# Patient Record
Sex: Male | Born: 1990 | Race: White | Hispanic: No | Marital: Married | Smoking: Never smoker
Health system: Southern US, Community
[De-identification: ages and names within clinical notes are randomized; demographics above are authoritative.]

## PROBLEM LIST (undated history)

## (undated) DIAGNOSIS — J309 Allergic rhinitis, unspecified: Secondary | ICD-10-CM

## (undated) DIAGNOSIS — R197 Diarrhea, unspecified: Secondary | ICD-10-CM

## (undated) DIAGNOSIS — K219 Gastro-esophageal reflux disease without esophagitis: Secondary | ICD-10-CM

## (undated) DIAGNOSIS — K529 Noninfective gastroenteritis and colitis, unspecified: Secondary | ICD-10-CM

## (undated) DIAGNOSIS — F909 Attention-deficit hyperactivity disorder, unspecified type: Secondary | ICD-10-CM

## (undated) HISTORY — DX: Allergic rhinitis, unspecified: J30.9

## (undated) HISTORY — DX: Diarrhea, unspecified: R19.7

## (undated) HISTORY — DX: Noninfective gastroenteritis and colitis, unspecified: K52.9

## (undated) HISTORY — DX: Gastro-esophageal reflux disease without esophagitis: K21.9

## (undated) HISTORY — DX: Attention-deficit hyperactivity disorder, unspecified type: F90.9

---
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
## 2012-06-29 ENCOUNTER — Ambulatory Visit (INDEPENDENT_AMBULATORY_CARE_PROVIDER_SITE_OTHER): Payer: PRIVATE HEALTH INSURANCE | Admitting: Pediatrics

## 2012-06-29 ENCOUNTER — Encounter: Payer: Self-pay | Admitting: Pediatrics

## 2012-06-29 VITALS — Temp 98.6°F | Wt 196.0 lb

## 2012-06-29 DIAGNOSIS — J069 Acute upper respiratory infection, unspecified: Secondary | ICD-10-CM

## 2012-06-29 DIAGNOSIS — J309 Allergic rhinitis, unspecified: Secondary | ICD-10-CM

## 2012-06-29 HISTORY — DX: Allergic rhinitis, unspecified: J30.9

## 2012-06-29 NOTE — Progress Notes (Signed)
Patient ID: Gregory Reeves, male   DOB: 1990/06/05, 22 y.o.   MRN: [id number] Subjective:     Gregory Reeves is a 22 y.o. male who presents for evaluation of symptoms of a URI, and allergies, that happen this time of year. Symptoms include congestion, coryza and fever mild to 100. Onset of symptoms was a few days ago, and has been unchanged since that time. Treatment to date: antihistamines. Dad had a cold last week. The pt takes Claritin but it has not helped him the last couple of days. He has a h/o asthma, but has not had symptoms since childhood.  The following portions of the patient's history were reviewed and updated as appropriate: allergies, current medications, past family history, past medical history, past social history, past surgical history and problem list. The pt is followed by GI for abdominal issues.  Review of Systems Pertinent items are noted in HPI.   Objective:    Temp(Src) 98.6 F (37 C) (Temporal)  Wt 196 lb (88.905 kg)  BMI 29.81 kg/m2 General appearance: alert and cooperative Eyes: conjunctivae/corneas clear. PERRL, EOM's intact. Fundi benign. Ears: normal TM's and external ear canals both ears Nose: clear discharge, moderate congestion, no sinus tenderness, no polyps Throat: lips, mucosa, and tongue normal; teeth and gums normal Lungs: clear to auscultation bilaterally Chest wall: no tenderness, RRR   Assessment:    allergic rhinitis and viral upper respiratory illness   Plan:    Discussed diagnosis and treatment of URI. Suggested symptomatic OTC remedies. Follow up as needed. Needs Well check soon, last was Oct 2012.  Continue Claritin.

## 2012-06-29 NOTE — Patient Instructions (Signed)
Allergic Rhinitis Allergic rhinitis is when the mucous membranes in the nose respond to allergens. Allergens are particles in the air that cause your body to have an allergic reaction. This causes you to release allergic antibodies. Through a chain of events, these eventually cause you to release histamine into the blood stream (hence the use of antihistamines). Although meant to be protective to the body, it is this release that causes your discomfort, such as frequent sneezing, congestion and an itchy runny nose.  CAUSES  The pollen allergens may come from grasses, trees, and weeds. This is seasonal allergic rhinitis, or "hay fever." Other allergens cause year-round allergic rhinitis (perennial allergic rhinitis) such as house dust mite allergen, pet dander and mold spores.  SYMPTOMS   Nasal stuffiness (congestion).  Runny, itchy nose with sneezing and tearing of the eyes.  There is often an itching of the mouth, eyes and ears. It cannot be cured, but it can be controlled with medications. DIAGNOSIS  If you are unable to determine the offending allergen, skin or blood testing may find it. TREATMENT   Avoid the allergen.  Medications and allergy shots (immunotherapy) can help.  Hay fever may often be treated with antihistamines in pill or nasal spray forms. Antihistamines block the effects of histamine. There are over-the-counter medicines that may help with nasal congestion and swelling around the eyes. Check with your caregiver before taking or giving this medicine. If the treatment above does not work, there are many new medications your caregiver can prescribe. Stronger medications may be used if initial measures are ineffective. Desensitizing injections can be used if medications and avoidance fails. Desensitization is when a patient is given ongoing shots until the body becomes less sensitive to the allergen. Make sure you follow up with your caregiver if problems continue. SEEK MEDICAL  CARE IF:   You develop fever (more than 100.5 F (38.1 C).  You develop a cough that does not stop easily (persistent).  You have shortness of breath.  You start wheezing.  Symptoms interfere with normal daily activities. Document Released: 11/13/2000 Document Revised: 05/13/2011 Document Reviewed: 05/25/2008 [facility] Patient Information 2013 [place], [place]. Upper Respiratory Infection, Adult An upper respiratory infection (URI) is also known as the common cold. It is often caused by a type of germ (virus). Colds are easily spread (contagious). You can pass it to others by kissing, coughing, sneezing, or drinking out of the same glass. Usually, you get better in 1 or 2 weeks.  HOME CARE   Only take medicine as told by your doctor.  Use a warm mist humidifier or breathe in steam from a hot shower.  Drink enough water and fluids to keep your pee (urine) clear or pale yellow.  Get plenty of rest.  Return to work when your temperature is back to normal or as told by your doctor. You may use a face mask and wash your hands to stop your cold from spreading. GET HELP RIGHT AWAY IF:   After the first few days, you feel you are getting worse.  You have questions about your medicine.  You have chills, shortness of breath, or brown or red spit (mucus).  You have yellow or brown snot (nasal discharge) or pain in the face, especially when you bend forward.  You have a fever, puffy (swollen) neck, pain when you swallow, or white spots in the back of your throat.  You have a bad headache, ear pain, sinus pain, or chest pain.  You have a  high-pitched whistling sound when you breathe in and out (wheezing).  You have a lasting cough or cough up blood.  You have sore muscles or a stiff neck. MAKE SURE YOU:   Understand these instructions.  Will watch your condition.  Will get help right away if you are not doing well or get worse. Document Released: 08/07/2007 Document Revised:  05/13/2011 Document Reviewed: 06/25/2010 [facility] Patient Information 2013 [place], [place].

## 2012-10-26 ENCOUNTER — Ambulatory Visit: Payer: Self-pay

## 2012-10-26 ENCOUNTER — Other Ambulatory Visit: Payer: Self-pay | Admitting: Occupational Medicine

## 2012-10-26 DIAGNOSIS — Z0000 Encounter for general adult medical examination without abnormal findings: Secondary | ICD-10-CM

## 2014-11-14 ENCOUNTER — Ambulatory Visit (INDEPENDENT_AMBULATORY_CARE_PROVIDER_SITE_OTHER): Payer: PRIVATE HEALTH INSURANCE | Admitting: Family Medicine

## 2014-11-14 VITALS — BP 122/90 | HR 76 | Temp 98.2°F | Resp 18 | Ht 68.0 in | Wt 205.4 lb

## 2014-11-14 DIAGNOSIS — R110 Nausea: Secondary | ICD-10-CM | POA: Diagnosis not present

## 2014-11-14 DIAGNOSIS — H8309 Labyrinthitis, unspecified ear: Secondary | ICD-10-CM

## 2014-11-14 DIAGNOSIS — R42 Dizziness and giddiness: Secondary | ICD-10-CM

## 2014-11-14 DIAGNOSIS — K625 Hemorrhage of anus and rectum: Secondary | ICD-10-CM | POA: Diagnosis not present

## 2014-11-14 DIAGNOSIS — B349 Viral infection, unspecified: Secondary | ICD-10-CM | POA: Diagnosis not present

## 2014-11-14 DIAGNOSIS — G43809 Other migraine, not intractable, without status migrainosus: Secondary | ICD-10-CM | POA: Diagnosis not present

## 2014-11-14 LAB — POCT CBC
GRANULOCYTE PERCENT: 60.5 % (ref 37–80)
HCT, POC: 50.8 % (ref 43.5–53.7)
Hemoglobin: 16.7 g/dL (ref 14.1–18.1)
Lymph, poc: 3.7 — AB (ref 0.6–3.4)
MCH: 28 pg (ref 27–31.2)
MCHC: 32.8 g/dL (ref 31.8–35.4)
MCV: 85.3 fL (ref 80–97)
MID (cbc): 0.4 (ref 0–0.9)
MPV: 7.2 fL (ref 0–99.8)
PLATELET COUNT, POC: 270 10*3/uL (ref 142–424)
POC Granulocyte: 6.4 (ref 2–6.9)
POC LYMPH %: 35.4 % (ref 10–50)
POC MID %: 4.1 %M (ref 0–12)
RBC: 5.96 M/uL (ref 4.69–6.13)
RDW, POC: 12.7 %
WBC: 10.5 10*3/uL — AB (ref 4.6–10.2)

## 2014-11-14 LAB — HEMOCCULT GUIAC POC 1CARD (OFFICE)
FECAL OCCULT BLD: NEGATIVE
OCCULT BLOOD DATE: 9122016

## 2014-11-14 MED ORDER — HYDROCORTISONE ACETATE 25 MG RE SUPP: 25.0000 mg | Freq: Two times a day (BID) | RECTAL | Status: DC

## 2014-11-14 MED ORDER — KETOROLAC TROMETHAMINE 60 MG/2ML IM SOLN
60.0000 mg | Freq: Once | INTRAMUSCULAR | Status: AC
  Administered 2014-11-14: 60 mg via INTRAMUSCULAR

## 2014-11-14 MED ORDER — ONDANSETRON 4 MG PO TBDP: 4.0000 mg | ORAL_TABLET | Freq: Four times a day (QID) | ORAL | Status: DC | PRN

## 2014-11-14 MED ORDER — PROMETHAZINE HCL 25 MG/ML IJ SOLN
25.0000 mg | Freq: Once | INTRAMUSCULAR | Status: AC
  Administered 2014-11-14: 25 mg via INTRAMUSCULAR

## 2014-11-14 MED ORDER — MECLIZINE HCL 25 MG PO TABS: 25.0000 mg | ORAL_TABLET | Freq: Three times a day (TID) | ORAL | Status: DC | PRN

## 2014-11-14 NOTE — Patient Instructions (Addendum)
Labyrinthitis (Inner Ear Inflammation) Your exam shows you have an inner ear disturbance or labyrinthitis. The cause of this condition is not known. But it may be due to a virus infection. The symptoms of labyrinthitis include vertigo or dizziness made worse by motion, nausea and vomiting. The onset of labyrinthitis may be very sudden. It usually lasts for a few days and then clears up over 1-2 weeks. The treatment of an inner ear disturbance includes bed rest and medications to reduce dizziness, nausea, and vomiting. You should stay away from alcohol, tranquilizers, caffeine, nicotine, or any medicine your doctor thinks may make your symptoms worse. Further testing may be needed to evaluate your hearing and balance system. Please see your doctor or go to the emergency room right away if you have:  Increasing vertigo, earache, loss of hearing, or ear drainage.  Headache, blurred vision, trouble walking, fainting, or fever.  Persistent vomiting, dehydration, or extreme weakness. Document Released: 02/18/2005 Document Revised: 05/13/2011 Document Reviewed: 08/06/2006 [facility] Patient Information 2015 [place], [place]. This information is not intended to replace advice given to you by your health care provider. Make sure you discuss any questions you have with your health care provider.  Dehydration, Adult Dehydration is when you lose more fluids from the body than you take in. Vital organs like the kidneys, brain, and heart cannot function without a proper amount of fluids and salt. Any loss of fluids from the body can cause dehydration.  CAUSES   Vomiting.  Diarrhea.  Excessive sweating.  Excessive urine output.  Fever. SYMPTOMS  Mild dehydration  Thirst.  Dry lips.  Slightly dry mouth. Moderate dehydration  Very dry mouth.  Sunken eyes.  Skin does not bounce back quickly when lightly pinched and released.  Dark urine and decreased urine production.  Decreased tear  production.  Headache. Severe dehydration  Very dry mouth.  Extreme thirst.  Rapid, weak pulse (more than 100 beats per minute at rest).  Cold hands and feet.  Not able to sweat in spite of heat and temperature.  Rapid breathing.  Blue lips.  Confusion and lethargy.  Difficulty being awakened.  Minimal urine production.  No tears. DIAGNOSIS  Your caregiver will diagnose dehydration based on your symptoms and your exam. Blood and urine tests will help confirm the diagnosis. The diagnostic evaluation should also identify the cause of dehydration. TREATMENT  Treatment of mild or moderate dehydration can often be done at home by increasing the amount of fluids that you drink. It is best to drink small amounts of fluid more often. Drinking too much at one time can make vomiting worse. Refer to the home care instructions below. Severe dehydration needs to be treated at the hospital where you will probably be given intravenous (IV) fluids that contain water and electrolytes. HOME CARE INSTRUCTIONS   Ask your caregiver about specific rehydration instructions.  Drink enough fluids to keep your urine clear or pale yellow.  Drink small amounts frequently if you have nausea and vomiting.  Eat as you normally do.  Avoid:  Foods or drinks high in sugar.  Carbonated drinks.  Juice.  Extremely hot or cold fluids.  Drinks with caffeine.  Fatty, greasy foods.  Alcohol.  Tobacco.  Overeating.  Gelatin desserts.  Wash your hands well to avoid spreading bacteria and viruses.  Only take over-the-counter or prescription medicines for pain, discomfort, or fever as directed by your caregiver.  Ask your caregiver if you should continue all prescribed and over-the-counter medicines.  Keep all  follow-up appointments with your caregiver. SEEK MEDICAL CARE IF:  You have abdominal pain and it increases or stays in one area (localizes).  You have a rash, stiff neck, or  severe headache.  You are irritable, sleepy, or difficult to awaken.  You are weak, dizzy, or extremely thirsty. SEEK IMMEDIATE MEDICAL CARE IF:   You are unable to keep fluids down or you get worse despite treatment.  You have frequent episodes of vomiting or diarrhea.  You have blood or green matter (bile) in your vomit.  You have blood in your stool or your stool looks black and tarry.  You have not urinated in 6 to 8 hours, or you have only urinated a small amount of very dark urine.  You have a fever.  You faint. MAKE SURE YOU:   Understand these instructions.  Will watch your condition.  Will get help right away if you are not doing well or get worse. Document Released: 02/18/2005 Document Revised: 05/13/2011 Document Reviewed: 10/08/2010 [facility] Patient Information 2015 [place], [place]. This information is not intended to replace advice given to you by your health care provider. Make sure you discuss any questions you have with your health care provider.  About Hemorrhoids  Hemorrhoids are swollen veins in the lower rectum and anus.  Also called piles, hemorrhoids are a common problem.  Hemorrhoids may be internal (inside the rectum) or external (around the anus).  Internal Hemorrhoids  Internal hemorrhoids are often painless, but they rarely cause bleeding.  The internal veins may stretch and fall down (prolapse) through the anus to the outside of the body.  The veins may then become irritated and painful.  External Hemorrhoids  External hemorrhoids can be easily seen or felt around the anal opening.  They are under the skin around the anus.  When the swollen veins are scratched or broken by straining, rubbing or wiping they sometimes bleed.  How Hemorrhoids Occur  Veins in the rectum and around the anus tend to swell under pressure.  Hemorrhoids can result from increased pressure in the veins of your anus or rectum.  Some sources of pressure are:   Straining  to have a bowel movement because of constipation  Waiting too long to have a bowel movement  Coughing and sneezing often  Sitting for extended periods of time, including on the toilet  Diarrhea  Obesity  Trauma or injury to the anus  Some liver diseases  Stress  Family history of hemorrhoids  Pregnancy  Pregnant women should try to avoid becoming constipated, because they are more likely to have hemorrhoids during pregnancy.  In the last trimester of pregnancy, the enlarged uterus may press on blood vessels and causes hemorrhoids.  In addition, the strain of childbirth sometimes causes hemorrhoids after the birth.  Symptoms of Hemorrhoids  Some symptoms of hemorrhoids include:  Swelling and/or a tender lump around the anus  Itching, mild burning and bleeding around the anus  Painful bowel movements with or without constipation  Bright red blood covering the stool, on toilet paper or in the toilet bowel.   Symptoms usually go away within a few days.  Always talk to your doctor about any bleeding to make sure it is not from some other causes.  Diagnosing and Treating Hemorrhoids  Diagnosis is made by an examination by your healthcare provider.  Special test can be performed by your doctor.    Most cases of hemorrhoids can be treated with:  High-fiber diet: Eat more high-fiber foods,  which help prevent constipation.  Ask for more detailed fiber information on types and sources of fiber from your healthcare provider.  Fluids: Drink plenty of water.  This helps soften bowel movements so they are easier to pass.  Sitz baths and cold packs: Sitting in lukewarm water two or three times a day for 15 minutes cleases the anal area and may relieve discomfort.  If the water is too hot, swelling around the anus will get worse.  Placing a cloth-covered ice pack on the anus for ten minutes four times a day can also help reduce selling.  Gently pushing a prolapsed hemorrhoid back inside  after the bath or ice pack can be helpful.  Medications: For mild discomfort, your healthcare provider may suggest over-the-counter pain medication or prescribe a cream or ointment for topical use.  The cream may contain witch hazel, zinc oxide or petroleum jelly.  Medicated suppositories are also a treatment option.  Always consult your doctor before applying medications or creams.  Procedures and surgeries: There are also a number of procedures and surgeries to shrink or remove hemorrhoids in more serious cases.  Talk to your physician about these options.  You can often prevent hemorrhoids or keep them from becoming worse by maintaining a healthy lifestyle.  Eat a fiber-rich diet of fruits, vegetables and whole grains.  Also, drink plenty of water and exercise regularly.   2007, [employer] Doc.30 Anal Fissure, Adult An anal fissure is a small tear or crack in the skin around the anus. Bleeding from a fissure usually stops on its own within a few minutes. However, bleeding will often reoccur with each bowel movement until the crack heals.  CAUSES   Passing large, hard stools.  Frequent diarrheal stools.  Constipation.  Inflammatory bowel disease (Crohn's disease or ulcerative colitis).  Infections.  Anal sex. SYMPTOMS   Small amounts of blood seen on your stools, on toilet paper, or in the toilet after a bowel movement.  Rectal bleeding.  Painful bowel movements.  Itching or irritation around the anus. DIAGNOSIS Your caregiver will examine the anal area. An anal fissure can usually be seen with careful inspection. A rectal exam may be performed and a short tube (anoscope) may be used to examine the anal canal. TREATMENT   You may be instructed to take fiber supplements. These supplements can soften your stool to help make bowel movements easier.  Sitz baths may be recommended to help heal the tear. Do not use soap in the sitz baths.  A medicated cream or  ointment may be prescribed to lessen discomfort. HOME CARE INSTRUCTIONS   Maintain a diet high in fruits, whole grains, and vegetables. Avoid constipating foods like bananas and dairy products.  Take sitz baths as directed by your caregiver.  Drink enough fluids to keep your urine clear or pale yellow.  Only take over-the-counter or prescription medicines for pain, discomfort, or fever as directed by your caregiver. Do not take aspirin as this may increase bleeding.  Do not use ointments containing numbing medications (anesthetics) or hydrocortisone. They could slow healing. SEEK MEDICAL CARE IF:   Your fissure is not completely healed within 3 days.  You have further bleeding.  You have a fever.  You have diarrhea mixed with blood.  You have pain.  Your problem is getting worse rather than better. MAKE SURE YOU:   Understand these instructions.  Will watch your condition.  Will get help right away if you are not doing well or  get worse. Document Released: 02/18/2005 Document Revised: 05/13/2011 Document Reviewed: 08/05/2010 [facility] Patient Information 2015 [place], [place]. This information is not intended to replace advice given to you by your health care provider. Make sure you discuss any questions you have with your health care provider.

## 2014-11-18 ENCOUNTER — Encounter: Payer: Self-pay | Admitting: Internal Medicine

## 2014-11-18 NOTE — Progress Notes (Signed)
Subjective:    Patient ID: Gregory Reeves, male    DOB: [birth year], 24 y.o.   MRN: [id number]  Chief Complaint  Patient presents with  . Fatigue    started this afternoon  . Generalized Body Aches  . Dizziness    room spinning  . Chills  . Headache    HPI  Here w/ wife.  Began feeling ill yest but sxs have rapidly progressed. Room spins around with any movement - stops brieflly when he sits still and closes eyes.  No h/o similar sxs prior.  + nausea, no vomiting Feels like he has a cold with HA, chills, severe fatigue. Has tried some tylenol w/ minimal relief.  No known sick contacts.  Has been having rectal pain during defication for several wks, worsening. Denies constipation/diarrhea. No h/o similar sxs prior. Has not tried anything for help. No melena or brbpr  Depression screen [facility] 2/9 11/14/2014  Decreased Interest 0  Down, Depressed, Hopeless 0  PHQ - 2 Score 0    Past Medical History  Diagnosis Date  . ADHD (attention deficit hyperactivity disorder)     does not require meds  . Asthma     h/o, no meds  . GERD (gastroesophageal reflux disease)   . Diarrhea   . Ileitis 01/2011    biopsies negative for IBD  . Allergic rhinitis 06/29/2012   Current Outpatient Prescriptions on File Prior to Visit  Medication Sig Dispense Refill  . diphenhydramine-acetaminophen (TYLENOL PM) 25-500 MG TABS Take 1 tablet by mouth at bedtime as needed.    Marland Kitchen Hyoscyamine Sulfate 0.375 MG TBCR Take 1 tablet (0.375 mg total) by mouth 2 (two) times daily as needed. 60 each 3  . loratadine (CLARITIN REDITABS) 10 MG dissolvable tablet Take 10 mg by mouth daily.    Marland Kitchen dexlansoprazole (DEXILANT) 60 MG capsule Take 60 mg by mouth daily.       No current facility-administered medications on file prior to visit.   No Known Allergies   Review of Systems  Constitutional: Positive for chills, diaphoresis, activity change, appetite change and fatigue. Negative for fever and unexpected weight change.   HENT: Positive for congestion and sore throat. Negative for ear pain and sinus pressure.   Eyes: Negative for photophobia, pain and visual disturbance.  Respiratory: Negative for cough, chest tightness, shortness of breath and wheezing.   Cardiovascular: Negative for chest pain and palpitations.  Gastrointestinal: Positive for nausea. Negative for vomiting, abdominal pain, diarrhea and constipation.  Genitourinary: Negative for dysuria.  Musculoskeletal: Positive for myalgias, back pain, arthralgias, gait problem and neck pain. Negative for joint swelling and neck stiffness.  Skin: Negative for rash.  Allergic/Immunologic: Negative for food allergies and immunocompromised state.  Neurological: Positive for dizziness, weakness, light-headedness and headaches. Negative for syncope and facial asymmetry.  Psychiatric/Behavioral: Negative for sleep disturbance and dysphoric mood.       Objective:  BP 122/90 mmHg  Pulse 76  Temp(Src) 98.2 F (36.8 C) (Oral)  Resp 18  Ht 5\' 8"  (1.727 m)  Wt 205 lb 6 oz (93.157 kg)  BMI 31.23 kg/m2  SpO2 98%  Physical Exam  Constitutional: He is oriented to person, place, and time. Vital signs are normal. He appears well-developed and well-nourished. He appears lethargic. He appears ill. No distress.  HENT:  Head: Normocephalic and atraumatic.  Right Ear: External ear and ear canal normal. Tympanic membrane is erythematous and retracted. A middle ear effusion is present.  Left Ear: External ear and  ear canal normal. Tympanic membrane is not erythematous and not bulging. A middle ear effusion is present.  Nose: Rhinorrhea present. No mucosal edema.  Mouth/Throat: Uvula is midline, oropharynx is clear and moist and mucous membranes are normal. No oropharyngeal exudate.  + Dix-Hallpike bilaterally by sxs but no nystagmus  Eyes: Conjunctivae are normal. No scleral icterus.  Neck: Normal range of motion. Neck supple. No thyromegaly present.  Cardiovascular:  Normal rate, regular rhythm, normal heart sounds and intact distal pulses.   Pulmonary/Chest: Effort normal and breath sounds normal. No respiratory distress.  Abdominal: Soft. Bowel sounds are normal. He exhibits no distension and no mass. There is no tenderness. There is no rebound and no guarding.  Genitourinary: Prostate normal. Rectal exam shows tenderness. Rectal exam shows no external hemorrhoid and anal tone normal. Guaiac negative stool.  Musculoskeletal: He exhibits no edema.  Lymphadenopathy:    He has no cervical adenopathy.  Neurological: He is oriented to person, place, and time. He appears lethargic.  Skin: Skin is warm and dry. He is not diaphoretic. No erythema.  Psychiatric: He has a normal mood and affect. His behavior is normal.   Results for orders placed or performed in visit on 11/14/14  POCT CBC  Result Value Ref Range   WBC 10.5 (A) 4.6 - 10.2 K/uL   Lymph, poc 3.7 (A) 0.6 - 3.4   POC LYMPH PERCENT 35.4 10 - 50 %L   MID (cbc) 0.4 0 - 0.9   POC MID % 4.1 0 - 12 %M   POC Granulocyte 6.4 2 - 6.9   Granulocyte percent 60.5 37 - 80 %G   RBC 5.96 4.69 - 6.13 M/uL   Hemoglobin 16.7 14.1 - 18.1 g/dL   HCT, POC 91.4 78.2 - 53.7 %   MCV 85.3 80 - 97 fL   MCH, POC 28.0 27 - 31.2 pg   MCHC 32.8 31.8 - 35.4 g/dL   RDW, POC 95.6 %   Platelet Count, POC 270 142 - 424 K/uL   MPV 7.2 0 - 99.8 fL  Hemoccult - 1 Card (office)  Result Value Ref Range   Fecal Occult Blood, POC Negative Negative   Card #1 Date [id number]    Card #2 Fecal Occult Blod, POC     Card #2 Date     Card #3 Fecal Occult Blood, POC     Card #3 Date         Assessment & Plan:   1. Vertigo  - suspect caused by viral labyrinthis so pt reassured that should spontaneously resolve, treat symptomatically w/ zofran and meclixine,    2. Other migraine without status migrainosus, not intractable - given IM toradol and promethazine in office, push fluids.  3. Nausea without vomiting   4. Rectal bleeding -  no etiology seen on exam but suspect he might have an internal rectal fissure - increase fiber and try anusol supp, GI referral placed.  5. Labyrinthitis, acute viral, unspecified laterality   6. Acute viral syndrome   RTC if worsening.  Orders Placed This Encounter  Procedures  . Ambulatory referral to Gastroenterology    Referral Priority:  Routine    Referral Type:  Consultation    Referral Reason:  Specialty Services Required    Number of Visits Requested:  1  . POCT CBC  . Hemoccult - 1 Card (office)    Meds ordered this encounter  Medications  . promethazine (PHENERGAN) injection 25 mg    Sig:   .  ketorolac (TORADOL) injection 60 mg    Sig:   . DISCONTD: meclizine (ANTIVERT) 25 MG tablet    Sig: Take 1 tablet (25 mg total) by mouth 3 (three) times daily as needed for dizziness.    Dispense:  30 tablet    Refill:  0  . DISCONTD: ondansetron (ZOFRAN ODT) 4 MG disintegrating tablet    Sig: Take 1 tablet (4 mg total) by mouth every 6 (six) hours as needed for nausea or vomiting.    Dispense:  20 tablet    Refill:  0  . hydrocortisone (ANUSOL-HC) 25 MG suppository    Sig: Place 1 suppository (25 mg total) rectally 2 (two) times daily.    Dispense:  12 suppository    Refill:  0  . ondansetron (ZOFRAN ODT) 4 MG disintegrating tablet    Sig: Take 1 tablet (4 mg total) by mouth every 6 (six) hours as needed for nausea or vomiting.    Dispense:  20 tablet    Refill:  0  . meclizine (ANTIVERT) 25 MG tablet    Sig: Take 1 tablet (25 mg total) by mouth 3 (three) times daily as needed for dizziness.    Dispense:  30 tablet    Refill:  0    I personally performed the services described in this documentation, which was scribed in my presence. The recorded information has been reviewed and considered, and addended by me as needed.  Norberto Sorenson, MD MPH

## 2014-11-25 ENCOUNTER — Ambulatory Visit: Payer: PRIVATE HEALTH INSURANCE | Admitting: Gastroenterology

## 2014-11-30 ENCOUNTER — Telehealth: Payer: Self-pay | Admitting: General Practice

## 2014-11-30 NOTE — Telephone Encounter (Signed)
Patient called in and confirmed his appt for 10/3 at 2:00pm

## 2014-12-05 ENCOUNTER — Encounter: Payer: Self-pay | Admitting: Gastroenterology

## 2014-12-05 ENCOUNTER — Ambulatory Visit (INDEPENDENT_AMBULATORY_CARE_PROVIDER_SITE_OTHER): Payer: PRIVATE HEALTH INSURANCE | Admitting: Gastroenterology

## 2014-12-05 VITALS — BP 149/84 | HR 81 | Temp 98.3°F | Ht 68.0 in | Wt 205.0 lb

## 2014-12-05 DIAGNOSIS — R197 Diarrhea, unspecified: Secondary | ICD-10-CM

## 2014-12-05 DIAGNOSIS — K625 Hemorrhage of anus and rectum: Secondary | ICD-10-CM | POA: Diagnosis not present

## 2014-12-05 NOTE — Patient Instructions (Addendum)
I sent in a special hemorrhoid cream with nitroglycerin compounded into it to [facility]. Use a thin amount twice a day.   I have also had you start a diarrhea medication called Viberzi. Take this twice a day with food. I have provided samples. If you like this, please let me know so I can send in a prescription.   We will see you in 4-6 weeks.

## 2014-12-05 NOTE — Progress Notes (Signed)
Referring Provider: Dr. Norberto Sorenson Primary GI: Dr. Jena Gauss   Chief Complaint  Patient presents with  . Rectal Bleeding    HPI:   Gregory Reeves is a 24 y.o. male presenting today with a history of abdominal pain, diarrhea. Extensive work-up in 2012. Colonoscopy with terminal ileoscopy showed cobblestoning of distal TI and some erosions. Biopsies were not consistent with Crohn's. MRE negative for IBD as well. [facility] labs not consistent with IBD. Last seen in May 2013.   Notes recurrent rectal bleeding. May 2016 had to change his underwear every hour. Last month felt dizzy. Went to urgent care and told he had vertigo. Mom gave him an herbal supplement. Has cream at home but hasn't used. Rectal pressure. Associated burning at times. Had prostate exam and felt like she just "hammered" him. Chronic diarrhea, usually about 8 loose stools per day. Not necessarily related to eating. Sometimes like a full BM but with blood around it. No bleeding since Saturday. Associated abdominal pain with diarrhea. No NSAIDs. Takes Benefiber BID. Levsin without improvement in past. No unexplained weight loss or lack of appetite.   Past Medical History  Diagnosis Date  . ADHD (attention deficit hyperactivity disorder)     does not require meds  . Asthma     h/o, no meds  . GERD (gastroesophageal reflux disease)   . Diarrhea   . Ileitis 01/2011    biopsies negative for IBD  . Allergic rhinitis 06/29/2012    Past Surgical History  Procedure Laterality Date  . 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 Abdominal exploration surgery  08/02/2010    [facility]  . Colonoscopy  01/14/2011    Dr. Ace Gins distal TI (5 cm) cobblestoning and tiny erosions, biopsies benign without evidence of IBD  . Esophagogastroduodenoscopy  01/14/2011    Dr. Elmer Ramp EGD, benign small bowel biopsy    No current outpatient prescriptions on file.   No current facility-administered medications  for this visit.    Allergies as of 12/05/2014  . (No Known Allergies)    Family History  Problem Relation Age of Onset  . Colon polyps Maternal Grandmother 40    partial colectomy secondary to numerous polyps  . Stomach cancer Maternal Grandfather 16    deceased  . Crohn's disease Neg Hx   . Liver disease Neg Hx     Social History   Social History  . Marital Status: Single    Spouse Name: N/A  . Number of Children: 0  . Years of Education: N/A   Occupational History  . works at [employer]        .     Social History Main Topics  . Smoking status: Never Smoker   . Smokeless tobacco: None  . Alcohol Use: No  . Drug Use: No  . Sexual Activity: Yes   Other Topics Concern  . None   Social History Narrative   Lives with both parents    Review of Systems: Negative unless mentioned in HPI.   Physical Exam: BP 149/84 mmHg  Pulse 81  Temp(Src) 98.3 F (36.8 C) (Oral)  Ht  (1.727 m)  Wt 205 lb (92.987 kg)  BMI 31.18 kg/m2 General:   Alert and oriented. No distress noted. Pleasant and cooperative.  Head:  Normocephalic and atraumatic. Eyes:  Conjuctiva clear without scleral icterus. Mouth:  Oral mucosa pink and moist. Good dentition. No lesions. Heart:  S1, S2 present without murmurs, rubs, or  gallops. Regular rate and rhythm. Abdomen:  +BS, soft, non-tender and non-distended. No rebound or guarding. No HSM or masses noted. Rectal: likely anterior small anal fissure. No internal exam done as patient was unable to relax enough to complete Msk:  Symmetrical without gross deformities. Normal posture. Extremities:  Without edema. Neurologic:  Alert and  oriented x4;  grossly normal neurologically. Skin:  Intact without significant lesions or rashes. Psych:  Alert and cooperative. Normal mood and affect.

## 2014-12-12 NOTE — Assessment & Plan Note (Signed)
Likely secondary to anal fissure. No medication used by patient as of yet; he is not using cream provided by PCP. Will trial the specially compounded [facility] hemorrhoid cream and add nitro to compound. Discussed with pharmacist. Apply BID. Return in 4-6 weeks. Colonoscopy on file as of 2012.

## 2014-12-12 NOTE — Assessment & Plan Note (Addendum)
24 year old male with thorough work-up in past and negative for IBD. Likely dealing with IBS-D. Failure of traditional anti-spasmodics. Trial VIBERZI 100 mg po BID, samples provided. If improved, will send in prescription. Return in 4-6 weeks.

## 2014-12-12 NOTE — Progress Notes (Signed)
CC'ED TO PCP 

## 2015-01-04 ENCOUNTER — Ambulatory Visit: Payer: Self-pay | Admitting: Gastroenterology

## 2015-01-19 ENCOUNTER — Encounter: Payer: Self-pay | Admitting: Gastroenterology

## 2015-01-19 ENCOUNTER — Ambulatory Visit (INDEPENDENT_AMBULATORY_CARE_PROVIDER_SITE_OTHER): Payer: PRIVATE HEALTH INSURANCE | Admitting: Gastroenterology

## 2015-01-19 VITALS — BP 139/88 | HR 52 | Temp 97.3°F | Ht 68.0 in | Wt 205.4 lb

## 2015-01-19 DIAGNOSIS — R197 Diarrhea, unspecified: Secondary | ICD-10-CM

## 2015-01-19 DIAGNOSIS — K625 Hemorrhage of anus and rectum: Secondary | ICD-10-CM | POA: Diagnosis not present

## 2015-01-19 LAB — COMPLETE METABOLIC PANEL WITH GFR
ALBUMIN: 4.4 g/dL (ref 3.6–5.1)
ALK PHOS: 91 U/L (ref 40–115)
ALT: 22 U/L (ref 9–46)
AST: 15 U/L (ref 10–40)
BILIRUBIN TOTAL: 1.1 mg/dL (ref 0.2–1.2)
BUN: 15 mg/dL (ref 7–25)
CALCIUM: 9.2 mg/dL (ref 8.6–10.3)
CO2: 26 mmol/L (ref 20–31)
CREATININE: 0.9 mg/dL (ref 0.60–1.35)
Chloride: 106 mmol/L (ref 98–110)
GFR, Est African American: >89 mL/min (ref >=60)
GFR, Est Non African American: >89 mL/min (ref >=60)
Glucose, Bld: 84 mg/dL (ref 65–99)
Potassium: 4.5 mmol/L (ref 3.5–5.3)
Sodium: 142 mmol/L (ref 135–146)
TOTAL PROTEIN: 6.5 g/dL (ref 6.1–8.1)

## 2015-01-19 LAB — CBC
HEMATOCRIT: 49.2 % (ref 39.0–52.0)
Hemoglobin: 17 g/dL (ref 13.0–17.0)
MCH: 29.3 pg (ref 26.0–34.0)
MCHC: 34.6 g/dL (ref 30.0–36.0)
MCV: 84.7 fL (ref 78.0–100.0)
MPV: 10 fL (ref 8.6–12.4)
Platelets: 231 10*3/uL (ref 150–400)
RBC: 5.81 MIL/uL (ref 4.22–5.81)
RDW: 13.6 % (ref 11.5–15.5)
WBC: 6.7 10*3/uL (ref 4.0–10.5)

## 2015-01-19 LAB — TSH: TSH: 1.766 u[IU]/mL (ref 0.350–4.500)

## 2015-01-19 LAB — C-REACTIVE PROTEIN

## 2015-01-19 MED ORDER — ELUXADOLINE 100 MG PO TABS: 1.0000 | ORAL_TABLET | Freq: Two times a day (BID) | ORAL | Status: DC

## 2015-01-19 NOTE — Progress Notes (Signed)
Referring Provider: Sherren MochaShaw, Eva N, MD Primary Care Physician:  Norberto SorensonSHAW,EVA, MD  Primary GI: Dr. Jena Gaussourk   Chief Complaint  Patient presents with  . Follow-up    HPI:   Gregory Reeves is a 24 y.o. male presenting today with a history of abdominal pain, diarrhea. Extensive work-up in 2012. Colonoscopy with terminal ileoscopy showed cobblestoning of distal TI and some erosions. Biopsies were not consistent with Crohn's. MRE negative for IBD as well. [facility] labs not consistent with IBD. Last seen Oct 2016 with recurrent rectal bleeding, chronic diarrhea. Prescribed [facility] cream and nitro compounded at last visit due to question of anal fissure. Given trial of Viberzi 100 mg BID at last visit.   Out of Viberzi. Frequency of stools is better, but still watery consistently. Still with rectal bleeding. Took cream from [facility], taking only once a day. Takes Tylenol, no NSAIDs or aspirin powders. Last blood work about a month ago. Only slight improvement in symptoms with supportive measures.   Past Medical History  Diagnosis Date  . ADHD (attention deficit hyperactivity disorder)     does not require meds  . Asthma     h/o, no meds  . GERD (gastroesophageal reflux disease)   . Diarrhea   . Ileitis 01/2011    biopsies negative for IBD  . Allergic rhinitis 06/29/2012    Past Surgical History  Procedure Laterality Date  . 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 Abdominal exploration surgery  08/02/2010    [facility]  . Colonoscopy  01/14/2011    Dr. Ace Ginsourk-Abnormal distal TI (5 cm) cobblestoning and tiny erosions, biopsies benign without evidence of IBD  . Esophagogastroduodenoscopy  01/14/2011    Dr. Elmer Rampourk-Normal EGD, benign small bowel biopsy    Current Outpatient Prescriptions  Medication Sig Dispense Refill  . Eluxadoline (VIBERZI) 100 MG TABS Take 1 tablet by mouth 2 (two) times daily with a meal. 60 tablet 3   No current  facility-administered medications for this visit.    Allergies as of 01/19/2015  . (No Known Allergies)    Family History  Problem Relation Age of Onset  . Colon polyps Maternal Grandmother 40    partial colectomy secondary to numerous polyps  . Stomach cancer Maternal Grandfather [id number]    deceased  . Crohn's disease Neg Hx   . Liver disease Neg Hx     Social History   Social History  . Marital Status: Single    Spouse Name: N/A  . Number of Children: 0  . Years of Education: N/A   Occupational History  . works at [employer]        .     Social History Main Topics  . Smoking status: Never Smoker   . Smokeless tobacco: None  . Alcohol Use: No  . Drug Use: No  . Sexual Activity: Yes   Other Topics Concern  . None   Social History Narrative   Lives with both parents    Review of Systems: As mentioned in HPI   Physical Exam: BP 139/88 mmHg  Pulse 52  Temp(Src) 97.3 F (36.3 C) (Oral)  Ht 5\' 8"  (1.727 m)  Wt 205 lb 6.4 oz (93.169 kg)  BMI 31.24 kg/m2 General:   Alert and oriented. No distress noted. Pleasant and cooperative.  Head:  Normocephalic and atraumatic. Eyes:  Conjuctiva clear without scleral icterus. Mouth:  Oral mucosa pink and moist. Good dentition. No lesions. Heart:  S1, S2  present without murmurs, rubs, or gallops. Regular rate and rhythm. Abdomen:  +BS, soft, non-tender and non-distended. No rebound or guarding. No HSM or masses noted. Msk:  Symmetrical without gross deformities. Normal posture. Extremities:  Without edema. Neurologic:  Alert and  oriented x4;  grossly normal neurologically. Psych:  Alert and cooperative. Normal mood and affect.

## 2015-01-19 NOTE — Patient Instructions (Signed)
I have provided a prescription for Viberzi to take twice a day with food. You have a copay card that will give you a month supply free and a discount on refills.   Please have blood work done.   If you are able to do the colonoscopy sooner, please let me know. Otherwise, we will see you in January to arrange this.   Increase the rectal cream to twice a day.

## 2015-01-19 NOTE — Assessment & Plan Note (Signed)
[age] year old male with thorough work-up in 2012 for diarrhea and rectal bleeding but negative for IBD. Very slight improvement noted with Viberzi 100 mg BID and still with rectal bleeding despite supportive measures Robbie Lis([facility] cream/nitro compound). Concern for evolving IBD remains in differential. I have discussed a repeat colonoscopy with ileocolonic biospies; he is on his mother's insurance and has been told to wait until the first of the year. I discussed with him the importance of further evaluation. In the meantime, we will check a CBC, CMP, TSH. I have also ordered a CRP and sed rate in case of evolving IBD so we may have a baseline. Return in January to set up colonoscopy unless he is able to complete sooner. Continue Viberzi for now, and increase rectal cream to BID.

## 2015-01-20 LAB — SEDIMENTATION RATE: Sed Rate: 1 mm/hr (ref 0–15)

## 2015-01-23 NOTE — Progress Notes (Signed)
cc'd to pcp 

## 2015-01-23 NOTE — Progress Notes (Signed)
Quick Note:  All labs are normal to include CMP, CBC, TSH, CRP, and sed rate. I still recommend a colonoscopy sooner rather than later, but he is coming back in Jan 2017 to arrange this. ______

## 2015-02-09 ENCOUNTER — Encounter: Payer: Self-pay | Admitting: Internal Medicine

## 2015-03-08 ENCOUNTER — Ambulatory Visit: Payer: Self-pay | Admitting: Gastroenterology

## 2015-03-09 ENCOUNTER — Ambulatory Visit: Payer: PRIVATE HEALTH INSURANCE | Admitting: Gastroenterology

## 2015-03-24 ENCOUNTER — Telehealth: Payer: Self-pay

## 2015-03-24 NOTE — Telephone Encounter (Signed)
PT called and paid prior balance of $52.33 by phone on 03/24/15/// Req. Receipt by mail// sent outgoing 03/24/15  [facility]

## 2015-04-04 ENCOUNTER — Other Ambulatory Visit: Payer: Self-pay

## 2015-04-04 ENCOUNTER — Encounter: Payer: Self-pay | Admitting: Gastroenterology

## 2015-04-04 ENCOUNTER — Ambulatory Visit (INDEPENDENT_AMBULATORY_CARE_PROVIDER_SITE_OTHER): Payer: PRIVATE HEALTH INSURANCE | Admitting: Gastroenterology

## 2015-04-04 VITALS — BP 134/86 | HR 72 | Temp 96.8°F | Ht 68.0 in | Wt 205.8 lb

## 2015-04-04 DIAGNOSIS — K625 Hemorrhage of anus and rectum: Secondary | ICD-10-CM

## 2015-04-04 DIAGNOSIS — R197 Diarrhea, unspecified: Secondary | ICD-10-CM

## 2015-04-04 MED ORDER — PEG 3350-KCL-NA BICARB-NACL 420 G PO SOLR: 4000.0000 mL | ORAL | Status: DC

## 2015-04-04 NOTE — Patient Instructions (Signed)
We have scheduled you for a colonoscopy with Dr. Jena Gauss in the near future.  I would recommend continuing Viberzi 100 mg twice a day.   Further recommendations to follow!

## 2015-04-04 NOTE — Progress Notes (Signed)
Referring Provider: Sherren Mocha, MD Primary Care Physician:  No PCP Per Patient  Primary GI: Dr. Jena Gauss   Chief Complaint  Patient presents with  . set up TCS    HPI:   Gregory Reeves is a 25 y.o. male presenting today with a history of abdominal pain, diarrhea. Extensive work-up in 2012. Colonoscopy with terminal ileoscopy showed cobblestoning of distal TI and some erosions. Biopsies were not consistent with Crohn's. MRE negative for IBD as well. [facility] labs not consistent with IBD. EGD at that time with normal small bowel biopsy. He has been prescribed Viberzi 100 mg BID. Due to recurrent rectal bleeding, colonoscopy was advised to rule out evolving IBD.   Viberzi 100 mg BID. Has helped but still no less than 8 times a day. Sometimes diarrhea but actually more solid now. Not associated with eating. Abdominal pain about the same. Still notes rectal bleeding. At last visit, labs including CBC, CMP, TSH, CRP, and sed rate ordered without any abnormalities.   Past Medical History  Diagnosis Date  . ADHD (attention deficit hyperactivity disorder)     does not require meds  . Asthma     h/o, no meds  . GERD (gastroesophageal reflux disease)   . Diarrhea   . Ileitis 01/2011    biopsies negative for IBD  . Allergic rhinitis 06/29/2012    Past Surgical History  Procedure Laterality Date  . 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 Abdominal exploration surgery  08/02/2010    [facility] hospital  . Colonoscopy  01/14/2011    Dr. Ace Gins distal TI (5 cm) cobblestoning and tiny erosions, biopsies benign without evidence of IBD  . Esophagogastroduodenoscopy  01/14/2011    Dr. Elmer Ramp EGD, benign small bowel biopsy    Current Outpatient Prescriptions  Medication Sig Dispense Refill  . Eluxadoline (VIBERZI) 100 MG TABS Take 1 tablet by mouth 2 (two) times daily with a meal. 60 tablet 3   No current facility-administered medications for this visit.     Allergies as of 04/04/2015  . (No Known Allergies)    Family History  Problem Relation Age of Onset  . Colon polyps Maternal Grandmother 40    partial colectomy secondary to numerous polyps  . Stomach cancer Maternal Grandfather 64    deceased  . Crohn's disease Neg Hx   . Liver disease Neg Hx     Social History   Social History  . Marital Status: Single    Spouse Name: N/A  . Number of Children: 0  . Years of Education: N/A   Occupational History  . works at [employer]        .     Social History Main Topics  . Smoking status: Never Smoker   . Smokeless tobacco: None  . Alcohol Use: No  . Drug Use: No  . Sexual Activity: Yes   Other Topics Concern  . None   Social History Narrative   Lives with both parents    Review of Systems: As mentioned in HPI.   Physical Exam: BP 134/86 mmHg  Pulse 72  Temp(Src) 96.8 F (36 C)  Ht  (1.727 m)  Wt 205 lb 12.8 oz (93.35 kg)  BMI 31.30 kg/m2 General:   Alert and oriented. No distress noted. Pleasant and cooperative.  Head:  Normocephalic and atraumatic. Eyes:  Conjuctiva clear without scleral icterus. Mouth:  Oral mucosa pink and moist. Good dentition. No lesions. Heart:  S1, S2  present without murmurs, rubs, or gallops. Regular rate and rhythm. Abdomen:  +BS, soft, non-tender and non-distended. No rebound or guarding. No HSM or masses noted. Msk:  Symmetrical without gross deformities. Normal posture. Extremities:  Without edema. Neurologic:  Alert and  oriented x4;  grossly normal neurologically. Skin:  Intact without significant lesions or rashes. Psych:  Alert and cooperative. Normal mood and affect.

## 2015-04-11 NOTE — Assessment & Plan Note (Signed)
Improvement noted with Viberzi 100 mg BID; however, he still has multiple loose stools per day. Colonoscopy as planned. May need to add Imodium prn. Small bowel biopsy negative in 2012. No stool studies obtained, as he has had consistent diarrhea chronically, dating back to at least 2012.

## 2015-04-11 NOTE — Assessment & Plan Note (Signed)
25 year old male with recurrent rectal bleeding in the setting of chronic diarrhea. Last colonoscopy in 2012 as noted in HPI, with negative evaluation for IBD. With his recurrent symptoms, query benign anorectal source in setting of diarrhea; however, unable to exclude evolving IBD. As of note, CBC, CMP, TSH, sed rate, CRP all normal.   Proceed with TCS with Dr. Jena Gauss in near future: the risks, benefits, and alternatives have been discussed with the patient in detail. The patient states understanding and desires to proceed. [facility] Cream provided to patient

## 2015-04-12 NOTE — Progress Notes (Signed)
cc'ed to pcp °

## 2015-04-28 ENCOUNTER — Encounter (HOSPITAL_COMMUNITY): Payer: Self-pay | Admitting: *Deleted

## 2015-04-28 ENCOUNTER — Ambulatory Visit (HOSPITAL_COMMUNITY)
Admission: RE | Admit: 2015-04-28 | Discharge: 2015-04-28 | Disposition: A | Discharge status: Home / Self Care | Payer: Managed Care, Other (non HMO) | Source: Ambulatory Visit | Attending: Internal Medicine | Admitting: Internal Medicine

## 2015-04-28 ENCOUNTER — Encounter (HOSPITAL_COMMUNITY)
Admission: RE | Disposition: A | Discharge status: Home / Self Care | Payer: Self-pay | Source: Ambulatory Visit | Attending: Internal Medicine

## 2015-04-28 DIAGNOSIS — K629 Disease of anus and rectum, unspecified: Secondary | ICD-10-CM

## 2015-04-28 DIAGNOSIS — K648 Other hemorrhoids: Secondary | ICD-10-CM | POA: Insufficient documentation

## 2015-04-28 DIAGNOSIS — R197 Diarrhea, unspecified: Secondary | ICD-10-CM | POA: Diagnosis not present

## 2015-04-28 DIAGNOSIS — K921 Melena: Secondary | ICD-10-CM | POA: Insufficient documentation

## 2015-04-28 DIAGNOSIS — Q2733 Arteriovenous malformation of digestive system vessel: Secondary | ICD-10-CM | POA: Diagnosis not present

## 2015-04-28 DIAGNOSIS — Z8371 Family history of colonic polyps: Secondary | ICD-10-CM | POA: Insufficient documentation

## 2015-04-28 DIAGNOSIS — K625 Hemorrhage of anus and rectum: Secondary | ICD-10-CM

## 2015-04-28 HISTORY — PX: BIOPSY: SHX5522

## 2015-04-28 HISTORY — PX: COLONOSCOPY: SHX5424

## 2015-04-28 SURGERY — COLONOSCOPY
Anesthesia: Moderate Sedation

## 2015-04-28 MED ORDER — ONDANSETRON HCL 4 MG/2ML IJ SOLN
INTRAMUSCULAR | Status: AC
  Filled 2015-04-28: qty 2

## 2015-04-28 MED ORDER — SODIUM CHLORIDE 0.9% FLUSH
INTRAVENOUS | Status: AC
  Filled 2015-04-28: qty 10

## 2015-04-28 MED ORDER — PROMETHAZINE HCL 25 MG/ML IJ SOLN
INTRAMUSCULAR | Status: AC
  Filled 2015-04-28: qty 1

## 2015-04-28 MED ORDER — SODIUM CHLORIDE 0.9 % IV SOLN
INTRAVENOUS | Status: DC
  Administered 2015-04-28: 14:00:00 via INTRAVENOUS

## 2015-04-28 MED ORDER — MIDAZOLAM HCL 5 MG/5ML IJ SOLN
INTRAMUSCULAR | Status: DC | PRN
  Administered 2015-04-28 (×3): 2 mg via INTRAVENOUS

## 2015-04-28 MED ORDER — ONDANSETRON HCL 4 MG/2ML IJ SOLN
INTRAMUSCULAR | Status: DC | PRN
  Administered 2015-04-28: 4 mg via INTRAVENOUS

## 2015-04-28 MED ORDER — MIDAZOLAM HCL 5 MG/5ML IJ SOLN
INTRAMUSCULAR | Status: AC
  Filled 2015-04-28: qty 10

## 2015-04-28 MED ORDER — STERILE WATER FOR IRRIGATION IR SOLN
Status: DC | PRN
  Administered 2015-04-28: 14:00:00

## 2015-04-28 MED ORDER — PROMETHAZINE HCL 25 MG/ML IJ SOLN
25.0000 mg | Freq: Once | INTRAMUSCULAR | Status: AC
  Administered 2015-04-28: 25 mg via INTRAVENOUS

## 2015-04-28 MED ORDER — MEPERIDINE HCL 100 MG/ML IJ SOLN
INTRAMUSCULAR | Status: DC | PRN
  Administered 2015-04-28 (×3): 50 mg via INTRAVENOUS

## 2015-04-28 MED ORDER — MEPERIDINE HCL 100 MG/ML IJ SOLN
INTRAMUSCULAR | Status: AC
  Filled 2015-04-28: qty 2

## 2015-04-28 NOTE — H&P (View-Only) (Signed)
Referring Provider: Sherren Mocha, MD Primary Care Physician:  No PCP Per Patient  Primary GI: Dr. Jena Gauss   Chief Complaint  Patient presents with  . set up TCS    HPI:   Gregory Reeves is a 25 y.o. male presenting today with a history of abdominal pain, diarrhea. Extensive work-up in 2012. Colonoscopy with terminal ileoscopy showed cobblestoning of distal TI and some erosions. Biopsies were not consistent with Crohn's. MRE negative for IBD as well. [facility] labs not consistent with IBD. EGD at that time with normal small bowel biopsy. He has been prescribed Viberzi 100 mg BID. Due to recurrent rectal bleeding, colonoscopy was advised to rule out evolving IBD.   Viberzi 100 mg BID. Has helped but still no less than 8 times a day. Sometimes diarrhea but actually more solid now. Not associated with eating. Abdominal pain about the same. Still notes rectal bleeding. At last visit, labs including CBC, CMP, TSH, CRP, and sed rate ordered without any abnormalities.   Past Medical History  Diagnosis Date  . ADHD (attention deficit hyperactivity disorder)     does not require meds  . Asthma     h/o, no meds  . GERD (gastroesophageal reflux disease)   . Diarrhea   . Ileitis 01/2011    biopsies negative for IBD  . Allergic rhinitis 06/29/2012    Past Surgical History  Procedure Laterality Date  . 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 Abdominal exploration surgery  08/02/2010    [facility] hospital  . Colonoscopy  01/14/2011    Dr. Ace Gins distal TI (5 cm) cobblestoning and tiny erosions, biopsies benign without evidence of IBD  . Esophagogastroduodenoscopy  01/14/2011    Dr. Elmer Ramp EGD, benign small bowel biopsy    Current Outpatient Prescriptions  Medication Sig Dispense Refill  . Eluxadoline (VIBERZI) 100 MG TABS Take 1 tablet by mouth 2 (two) times daily with a meal. 60 tablet 3   No current facility-administered medications for this visit.     Allergies as of 04/04/2015  . (No Known Allergies)    Family History  Problem Relation Age of Onset  . Colon polyps Maternal Grandmother 40    partial colectomy secondary to numerous polyps  . Stomach cancer Maternal Grandfather 70    deceased  . Crohn's disease Neg Hx   . Liver disease Neg Hx     Social History   Social History  . Marital Status: Single    Spouse Name: N/A  . Number of Children: 0  . Years of Education: N/A   Occupational History  . works at [employer]        .     Social History Main Topics  . Smoking status: Never Smoker   . Smokeless tobacco: None  . Alcohol Use: No  . Drug Use: No  . Sexual Activity: Yes   Other Topics Concern  . None   Social History Narrative   Lives with both parents    Review of Systems: As mentioned in HPI.   Physical Exam: BP 134/86 mmHg  Pulse 72  Temp(Src) 96.8 F (36 C)  Ht  (1.727 m)  Wt 205 lb 12.8 oz (93.35 kg)  BMI 31.30 kg/m2 General:   Alert and oriented. No distress noted. Pleasant and cooperative.  Head:  Normocephalic and atraumatic. Eyes:  Conjuctiva clear without scleral icterus. Mouth:  Oral mucosa pink and moist. Good dentition. No lesions. Heart:  S1, S2  present without murmurs, rubs, or gallops. Regular rate and rhythm. Abdomen:  +BS, soft, non-tender and non-distended. No rebound or guarding. No HSM or masses noted. Msk:  Symmetrical without gross deformities. Normal posture. Extremities:  Without edema. Neurologic:  Alert and  oriented x4;  grossly normal neurologically. Skin:  Intact without significant lesions or rashes. Psych:  Alert and cooperative. Normal mood and affect.

## 2015-04-28 NOTE — Op Note (Signed)
[facility] [address] [place], [address]   COLONOSCOPY PROCEDURE REPORT  PATIENT: Gregory Reeves, Gregory Reeves  MR#: [id number] BIRTHDATE: Mar 21, 1990 , 24  yrs. old GENDER: male ENDOSCOPIST: R.  Roetta Sessions, MD FACP [facility] REFERRED Rod Holler, M.D. PROCEDURE DATE:  May 19, 2015 PROCEDURE:   Ileo-colonoscopy with biopsy INDICATIONS:   Hematochezia; diarrhea. MEDICATIONS: Versed 6 mg IV and Demerol 150 mg IV in divided doses. Zofran 4 mg IV.  Phenergan 25 mg IV. ASA CLASS:       Class II  CONSENT: The risks, benefits, alternatives and imponderables including but not limited to bleeding, perforation as well as the possibility of a missed lesion have been reviewed.  The potential for biopsy, lesion removal, etc. have also been discussed. Questions have been answered.  All parties agreeable.  Please see the history and physical in the medical record for more information.  DESCRIPTION OF PROCEDURE:   After the risks benefits and alternatives of the procedure were thoroughly explained, informed consent was obtained.  The digital rectal exam revealed no abnormalities of the rectum.   The EC-[id number] ([id number])  endoscope was introduced through the anus and advanced to the terminal ileum which was intubated for a short distance. No adverse events experienced.   The quality of the prep was adequate  The instrument was then slowly withdrawn as the colon was fully examined. Estimated blood loss is zero unless otherwise noted in this procedure report.      COLON FINDINGS: Minimal patchy erythema of the rectal mucosa.  May well be a variant of normal.  Minimal anal canal hemorrhoid tissue seen.  Normal-appearing colonic mucosa.  The distal 10 cm of terminal ileal mucosa appeared endoscopically normal aside from a tiny 3 mm AVM in the descending segment.  biopsiesof the terminal ileum as well as the rectal mucosa taken for histologic study.  Retroflexed views revealed no  abnormalities. .  Withdrawal time=13 minutes 0 seconds.  The scope was withdrawn and the procedure completed. COMPLICATIONS: There were no immediate complications.  ENDOSCOPIC IMPRESSION: Minimal anal canal hemorrhoids. Patchy erythema of the rectum may or may not be significant. Otherwise normal-appearing ileo-colonoscopy aside from an innocent appearing AVM -  status post terminal ileal biopsies.  I did not see a significant abnormality today. Symptoms out of proportion to objective findings.  RECOMMENDATIONS: We'll follow up on pathology will make further recommendations in the near future.  eSigned:  R. Roetta Sessions, MD Jerrel Ivory [facility] May 19, 2015 2:46 PM   cc:  CPT CODES: ICD CODES:  The ICD and CPT codes recommended by this software are interpretations from the data that the clinical staff has captured with the software.  The verification of the translation of this report to the ICD and CPT codes and modifiers is the sole responsibility of the health care institution and practicing physician where this report was generated.  [employer] will not be held responsible for the validity of the ICD and CPT codes included on this report.  AMA assumes no liability for data contained or not contained herein. CPT is a Publishing rights manager of the [employer].  PATIENT NAME:  Gregory Reeves, Gregory Reeves MR#: [id number]

## 2015-04-28 NOTE — Discharge Instructions (Signed)
  Colonoscopy Discharge Instructions  Read the instructions outlined below and refer to this sheet in the next few weeks. These discharge instructions provide you with general information on caring for yourself after you leave the hospital. Your doctor may also give you specific instructions. While your treatment has been planned according to the most current medical practices available, unavoidable complications occasionally occur. If you have any problems or questions after discharge, call Dr. Analeia Ismael at [phone]. ACTIVITY  You may resume your regular activity, but move at a slower pace for the next 24 hours.   Take frequent rest periods for the next 24 hours.   Walking will help get rid of the air and reduce the bloated feeling in your belly (abdomen).   No driving for 24 hours (because of the medicine (anesthesia) used during the test).    Do not sign any important legal documents or operate any machinery for 24 hours (because of the anesthesia used during the test).  NUTRITION  Drink plenty of fluids.   You may resume your normal diet as instructed by your doctor.   Begin with a light meal and progress to your normal diet. Heavy or fried foods are harder to digest and may make you feel sick to your stomach (nauseated).   Avoid alcoholic beverages for 24 hours or as instructed.  MEDICATIONS  You may resume your normal medications unless your doctor tells you otherwise.  WHAT YOU CAN EXPECT TODAY  Some feelings of bloating in the abdomen.   Passage of more gas than usual.   Spotting of blood in your stool or on the toilet paper.  IF YOU HAD POLYPS REMOVED DURING THE COLONOSCOPY:  No aspirin products for 7 days or as instructed.   No alcohol for 7 days or as instructed.   Eat a soft diet for the next 24 hours.  FINDING OUT THE RESULTS OF YOUR TEST Not all test results are available during your visit. If your test results are not back during the visit, make an appointment  with your caregiver to find out the results. Do not assume everything is normal if you have not heard from your caregiver or the medical facility. It is important for you to follow up on all of your test results.  SEEK IMMEDIATE MEDICAL ATTENTION IF:  You have more than a spotting of blood in your stool.   Your belly is swollen (abdominal distention).   You are nauseated or vomiting.   You have a temperature over 101.   You have abdominal pain or discomfort that is severe or gets worse throughout the day.   Further recommendations to follow pending review of pathology report 

## 2015-04-28 NOTE — Interval H&P Note (Signed)
   History and Physical Interval Note:  04/28/2015 2:06 PM  Gregory Reeves  has presented today for surgery, with the diagnosis of rectal bleeding  The various methods of treatment have been discussed with the patient and family. After consideration of risks, benefits and other options for treatment, the patient has consented to  Procedure(s) with comments: COLONOSCOPY (N/A) - 230 as a surgical intervention .  The patient's history has been reviewed, patient examined, no change in status, stable for surgery.  I have reviewed the patient's chart and labs.  Questions were answered to the patient's satisfaction.     Gregory Reeves  No change. Colonoscopy per plan. The risks, benefits, limitations, alternatives and imponderables have been reviewed with the patient. Questions have been answered. All parties are agreeable.

## 2015-05-01 ENCOUNTER — Telehealth: Payer: Self-pay

## 2015-05-01 NOTE — Telephone Encounter (Signed)
Pt had procedure on Friday 04/28/2015. States he woke up at 4:00am c/o abd pain states. He states the pain is an 8/10 scale.    Denies vomiting but is having some nausea.  States he ate sausage this morning. States that his last BM was Saturday and it looked like the rest of the prep coming out.   Pt denies fever and chills.  States he took  Tylenol at 5 am and that the pain hasn't gotten any better. States that is abdomin is soft.   Routing to [employer]

## 2015-05-01 NOTE — Telephone Encounter (Signed)
Routing to RMR. 

## 2015-05-01 NOTE — Telephone Encounter (Signed)
8 out of 10 abdominal pain noted. Procedure note reviewed. Doubt complication related to the procedure but patient reporting severe pain 2 days after procedure. He should go to the emergency room for evaluation.

## 2015-05-01 NOTE — Telephone Encounter (Signed)
Spoke with the pt, informed him that he should go to the ED. He said he was going to try to eat lunch first and if it didn't get any better he would go to the ED.

## 2015-05-01 NOTE — Telephone Encounter (Signed)
Noted  

## 2015-05-04 ENCOUNTER — Encounter: Payer: Self-pay | Admitting: Internal Medicine

## 2015-05-04 ENCOUNTER — Telehealth: Payer: Self-pay

## 2015-05-04 NOTE — Telephone Encounter (Signed)
Per RMR-  Send letter to patient.  Send copy of letter with path to referring provider and PCP.    Offer office follow-up with [place] regarding management of diarrhea which is likely functional in origin

## 2015-05-04 NOTE — Telephone Encounter (Signed)
Letter mailed to the pt. 

## 2015-05-04 NOTE — Telephone Encounter (Signed)
OV made and letter mailed °

## 2015-05-05 ENCOUNTER — Encounter (HOSPITAL_COMMUNITY): Payer: Self-pay | Admitting: Internal Medicine

## 2015-05-24 ENCOUNTER — Ambulatory Visit: Payer: Self-pay | Admitting: Gastroenterology

## 2019-09-24 ENCOUNTER — Emergency Department (HOSPITAL_COMMUNITY): Payer: BC Managed Care – PPO

## 2019-09-24 ENCOUNTER — Inpatient Hospital Stay (HOSPITAL_COMMUNITY)
Admission: EM | Admit: 2019-09-24 | Discharge: 2019-09-27 | DRG: 286 | Disposition: A | Discharge status: Home / Self Care | Payer: BC Managed Care – PPO | Attending: Cardiology | Admitting: Cardiology

## 2019-09-24 ENCOUNTER — Other Ambulatory Visit: Payer: Self-pay

## 2019-09-24 ENCOUNTER — Encounter (HOSPITAL_COMMUNITY)
Admission: EM | Disposition: A | Discharge status: Home / Self Care | Payer: Self-pay | Source: Home / Self Care | Attending: Cardiology

## 2019-09-24 ENCOUNTER — Encounter (HOSPITAL_COMMUNITY): Payer: Self-pay | Admitting: Emergency Medicine

## 2019-09-24 ENCOUNTER — Inpatient Hospital Stay (HOSPITAL_COMMUNITY): Payer: BC Managed Care – PPO

## 2019-09-24 DIAGNOSIS — R079 Chest pain, unspecified: Secondary | ICD-10-CM | POA: Diagnosis not present

## 2019-09-24 DIAGNOSIS — I495 Sick sinus syndrome: Secondary | ICD-10-CM | POA: Diagnosis present

## 2019-09-24 DIAGNOSIS — R001 Bradycardia, unspecified: Secondary | ICD-10-CM | POA: Diagnosis present

## 2019-09-24 DIAGNOSIS — I469 Cardiac arrest, cause unspecified: Secondary | ICD-10-CM | POA: Diagnosis not present

## 2019-09-24 DIAGNOSIS — I300 Acute nonspecific idiopathic pericarditis: Secondary | ICD-10-CM | POA: Diagnosis not present

## 2019-09-24 DIAGNOSIS — R55 Syncope and collapse: Secondary | ICD-10-CM | POA: Diagnosis not present

## 2019-09-24 DIAGNOSIS — I313 Pericardial effusion (noninflammatory): Secondary | ICD-10-CM | POA: Diagnosis not present

## 2019-09-24 DIAGNOSIS — F909 Attention-deficit hyperactivity disorder, unspecified type: Secondary | ICD-10-CM | POA: Diagnosis not present

## 2019-09-24 DIAGNOSIS — K219 Gastro-esophageal reflux disease without esophagitis: Secondary | ICD-10-CM | POA: Diagnosis present

## 2019-09-24 DIAGNOSIS — Z8616 Personal history of COVID-19: Secondary | ICD-10-CM | POA: Diagnosis not present

## 2019-09-24 DIAGNOSIS — Z8249 Family history of ischemic heart disease and other diseases of the circulatory system: Secondary | ICD-10-CM

## 2019-09-24 DIAGNOSIS — J309 Allergic rhinitis, unspecified: Secondary | ICD-10-CM | POA: Diagnosis not present

## 2019-09-24 DIAGNOSIS — J45909 Unspecified asthma, uncomplicated: Secondary | ICD-10-CM | POA: Diagnosis present

## 2019-09-24 DIAGNOSIS — E860 Dehydration: Secondary | ICD-10-CM | POA: Diagnosis not present

## 2019-09-24 DIAGNOSIS — J90 Pleural effusion, not elsewhere classified: Secondary | ICD-10-CM | POA: Diagnosis not present

## 2019-09-24 DIAGNOSIS — R072 Precordial pain: Secondary | ICD-10-CM | POA: Diagnosis not present

## 2019-09-24 HISTORY — DX: Bradycardia, unspecified: R00.1

## 2019-09-24 HISTORY — PX: LEFT HEART CATH AND CORONARY ANGIOGRAPHY: CATH118249

## 2019-09-24 LAB — LIPID PANEL
Cholesterol: 179 mg/dL (ref 0–200)
HDL: 22 mg/dL — ABNORMAL LOW (ref >40)
LDL Cholesterol: 117 mg/dL — ABNORMAL HIGH (ref 0–99)
Total CHOL/HDL Ratio: 8.1 RATIO
Triglycerides: 201 mg/dL — ABNORMAL HIGH (ref <150)
VLDL: 40 mg/dL (ref 0–40)

## 2019-09-24 LAB — BASIC METABOLIC PANEL
Anion gap: 8 (ref 5–15)
Anion gap: 10 (ref 5–15)
BUN: 11 mg/dL (ref 6–20)
BUN: 9 mg/dL (ref 6–20)
CO2: 23 mmol/L (ref 22–32)
CO2: 24 mmol/L (ref 22–32)
Calcium: 9 mg/dL (ref 8.9–10.3)
Calcium: 9.1 mg/dL (ref 8.9–10.3)
Chloride: 109 mmol/L (ref 98–111)
Chloride: 104 mmol/L (ref 98–111)
Creatinine, Ser: 0.8 mg/dL (ref 0.61–1.24)
Creatinine, Ser: 0.72 mg/dL (ref 0.61–1.24)
GFR calc Af Amer: >60 mL/min (ref >60)
GFR calc Af Amer: >60 mL/min (ref >60)
GFR calc non Af Amer: >60 mL/min (ref >60)
GFR calc non Af Amer: >60 mL/min (ref >60)
Glucose, Bld: 118 mg/dL — ABNORMAL HIGH (ref 70–99)
Glucose, Bld: 116 mg/dL — ABNORMAL HIGH (ref 70–99)
Potassium: 4 mmol/L (ref 3.5–5.1)
Potassium: 4.1 mmol/L (ref 3.5–5.1)
Sodium: 140 mmol/L (ref 135–145)
Sodium: 138 mmol/L (ref 135–145)

## 2019-09-24 LAB — CBC WITH DIFFERENTIAL/PLATELET
Abs Immature Granulocytes: 0.02 10*3/uL (ref 0.00–0.07)
Basophils Absolute: 0.1 10*3/uL (ref 0.0–0.1)
Basophils Relative: 1 %
Eosinophils Absolute: 0.2 10*3/uL (ref 0.0–0.5)
Eosinophils Relative: 2 %
HCT: 49 % (ref 39.0–52.0)
Hemoglobin: 16.7 g/dL (ref 13.0–17.0)
Immature Granulocytes: 0 %
Lymphocytes Relative: 15 %
Lymphs Abs: 1.9 10*3/uL (ref 0.7–4.0)
MCH: 29.1 pg (ref 26.0–34.0)
MCHC: 34.1 g/dL (ref 30.0–36.0)
MCV: 85.5 fL (ref 80.0–100.0)
Monocytes Absolute: 0.7 10*3/uL (ref 0.1–1.0)
Monocytes Relative: 5 %
Neutro Abs: 9.9 10*3/uL — ABNORMAL HIGH (ref 1.7–7.7)
Neutrophils Relative %: 77 %
Platelets: 264 10*3/uL (ref 150–400)
RBC: 5.73 MIL/uL (ref 4.22–5.81)
RDW: 12.9 % (ref 11.5–15.5)
WBC: 12.8 10*3/uL — ABNORMAL HIGH (ref 4.0–10.5)
nRBC: 0 % (ref 0.0–0.2)

## 2019-09-24 LAB — SEDIMENTATION RATE
Sed Rate: 3 mm/hr (ref 0–16)
Sed Rate: 3 mm/hr (ref 0–16)

## 2019-09-24 LAB — HEPATIC FUNCTION PANEL
ALT: 25 U/L (ref 0–44)
AST: 18 U/L (ref 15–41)
Albumin: 4.1 g/dL (ref 3.5–5.0)
Alkaline Phosphatase: 96 U/L (ref 38–126)
Bilirubin, Direct: 0.1 mg/dL (ref 0.0–0.2)
Indirect Bilirubin: 0.8 mg/dL (ref 0.3–0.9)
Total Bilirubin: 0.9 mg/dL (ref 0.3–1.2)
Total Protein: 6.8 g/dL (ref 6.5–8.1)

## 2019-09-24 LAB — HEMOGLOBIN A1C
Hgb A1c MFr Bld: 5.2 % (ref 4.8–5.6)
Mean Plasma Glucose: 102.54 mg/dL

## 2019-09-24 LAB — D-DIMER, QUANTITATIVE: D-Dimer, Quant: <0.27 ug/mL-FEU (ref 0.00–0.50)

## 2019-09-24 LAB — RAPID URINE DRUG SCREEN, HOSP PERFORMED
Amphetamines: NOT DETECTED
Barbiturates: NOT DETECTED
Benzodiazepines: POSITIVE — AB
Cocaine: NOT DETECTED
Opiates: NOT DETECTED
Tetrahydrocannabinol: NOT DETECTED

## 2019-09-24 LAB — C-REACTIVE PROTEIN: CRP: 2.3 mg/dL — ABNORMAL HIGH (ref <1.0)

## 2019-09-24 LAB — SARS CORONAVIRUS 2 BY RT PCR (HOSPITAL ORDER, PERFORMED IN ~~LOC~~ HOSPITAL LAB): SARS Coronavirus 2: NEGATIVE

## 2019-09-24 LAB — TROPONIN I (HIGH SENSITIVITY)
Troponin I (High Sensitivity): 3 ng/L (ref <18)
Troponin I (High Sensitivity): 3 ng/L (ref <18)

## 2019-09-24 LAB — MRSA PCR SCREENING: MRSA by PCR: NEGATIVE

## 2019-09-24 LAB — TSH: TSH: 2.2 u[IU]/mL (ref 0.350–4.500)

## 2019-09-24 LAB — ECHOCARDIOGRAM LIMITED
Height: 68 in
Weight: 3680 oz

## 2019-09-24 LAB — MAGNESIUM: Magnesium: 2 mg/dL (ref 1.7–2.4)

## 2019-09-24 LAB — HIV ANTIBODY (ROUTINE TESTING W REFLEX): HIV Screen 4th Generation wRfx: NONREACTIVE

## 2019-09-24 SURGERY — LEFT HEART CATH AND CORONARY ANGIOGRAPHY
Anesthesia: LOCAL

## 2019-09-24 MED ORDER — ACETAMINOPHEN 325 MG PO TABS: 650.0000 mg | ORAL_TABLET | ORAL | Status: DC | PRN

## 2019-09-24 MED ORDER — HEPARIN SODIUM (PORCINE) 1000 UNIT/ML IJ SOLN
INTRAMUSCULAR | Status: DC | PRN
  Administered 2019-09-24: 5000 [IU] via INTRAVENOUS

## 2019-09-24 MED ORDER — DOBUTAMINE IN D5W 4-5 MG/ML-% IV SOLN
INTRAVENOUS | Status: AC
  Administered 2019-09-24: 2.5 ug/kg/min via INTRAVENOUS
  Filled 2019-09-24: qty 250

## 2019-09-24 MED ORDER — SODIUM CHLORIDE 0.9 % IV SOLN: INTRAVENOUS | Status: AC

## 2019-09-24 MED ORDER — FENTANYL CITRATE (PF) 100 MCG/2ML IJ SOLN
INTRAMUSCULAR | Status: DC | PRN
  Administered 2019-09-24: 25 ug via INTRAVENOUS

## 2019-09-24 MED ORDER — FENTANYL CITRATE (PF) 100 MCG/2ML IJ SOLN
100.0000 ug | Freq: Once | INTRAMUSCULAR | Status: AC
  Administered 2019-09-24: 100 ug via INTRAVENOUS
  Filled 2019-09-24: qty 2

## 2019-09-24 MED ORDER — SODIUM CHLORIDE 0.9% FLUSH: 3.0000 mL | INTRAVENOUS | Status: DC | PRN

## 2019-09-24 MED ORDER — LIDOCAINE HCL (PF) 1 % IJ SOLN
INTRAMUSCULAR | Status: AC
  Filled 2019-09-24: qty 30

## 2019-09-24 MED ORDER — SODIUM CHLORIDE 0.9 % WEIGHT BASED INFUSION: 3.0000 mL/kg/h | INTRAVENOUS | Status: DC

## 2019-09-24 MED ORDER — SODIUM CHLORIDE 0.9 % IV SOLN: 250.0000 mL | INTRAVENOUS | Status: DC | PRN

## 2019-09-24 MED ORDER — SODIUM CHLORIDE 0.9% FLUSH: 3.0000 mL | Freq: Two times a day (BID) | INTRAVENOUS | Status: DC

## 2019-09-24 MED ORDER — ACETAMINOPHEN 325 MG PO TABS
650.0000 mg | ORAL_TABLET | ORAL | Status: DC | PRN
  Administered 2019-09-24 (×2): 650 mg via ORAL
  Filled 2019-09-24 (×2): qty 2

## 2019-09-24 MED ORDER — VERAPAMIL HCL 2.5 MG/ML IV SOLN
INTRAVENOUS | Status: AC
  Filled 2019-09-24: qty 2

## 2019-09-24 MED ORDER — SODIUM CHLORIDE 0.9 % WEIGHT BASED INFUSION: 1.0000 mL/kg/h | INTRAVENOUS | Status: DC

## 2019-09-24 MED ORDER — NAPROXEN 250 MG PO TABS
500.0000 mg | ORAL_TABLET | Freq: Two times a day (BID) | ORAL | Status: DC
  Administered 2019-09-24 – 2019-09-25 (×2): 500 mg via ORAL
  Filled 2019-09-24 (×2): qty 2

## 2019-09-24 MED ORDER — COLCHICINE 0.6 MG PO TABS
0.6000 mg | ORAL_TABLET | Freq: Two times a day (BID) | ORAL | Status: DC
  Administered 2019-09-24 – 2019-09-27 (×7): 0.6 mg via ORAL
  Filled 2019-09-24 (×7): qty 1

## 2019-09-24 MED ORDER — GADOBUTROL 1 MMOL/ML IV SOLN
11.0000 mL | Freq: Once | INTRAVENOUS | Status: AC | PRN
  Administered 2019-09-24: 11 mL via INTRAVENOUS

## 2019-09-24 MED ORDER — LIDOCAINE HCL (PF) 1 % IJ SOLN
INTRAMUSCULAR | Status: DC | PRN
  Administered 2019-09-24: 2 mL
  Administered 2019-09-24: 15 mL

## 2019-09-24 MED ORDER — MIDAZOLAM HCL 2 MG/2ML IJ SOLN
INTRAMUSCULAR | Status: DC | PRN
  Administered 2019-09-24: 1 mg via INTRAVENOUS

## 2019-09-24 MED ORDER — SODIUM CHLORIDE 0.9% FLUSH
3.0000 mL | Freq: Two times a day (BID) | INTRAVENOUS | Status: DC
  Administered 2019-09-25 – 2019-09-27 (×3): 3 mL via INTRAVENOUS

## 2019-09-24 MED ORDER — IOHEXOL 350 MG/ML SOLN
INTRAVENOUS | Status: DC | PRN
  Administered 2019-09-24: 110 mL

## 2019-09-24 MED ORDER — ONDANSETRON HCL 4 MG/2ML IJ SOLN
4.0000 mg | Freq: Four times a day (QID) | INTRAMUSCULAR | Status: DC | PRN
  Administered 2019-09-24: 4 mg via INTRAVENOUS
  Filled 2019-09-24: qty 2

## 2019-09-24 MED ORDER — ZOLPIDEM TARTRATE 5 MG PO TABS: 5.0000 mg | ORAL_TABLET | Freq: Every evening | ORAL | Status: DC | PRN

## 2019-09-24 MED ORDER — CHLORHEXIDINE GLUCONATE CLOTH 2 % EX PADS
6.0000 | MEDICATED_PAD | Freq: Every day | CUTANEOUS | Status: DC
  Administered 2019-09-24 – 2019-09-25 (×2): 6 via TOPICAL

## 2019-09-24 MED ORDER — VERAPAMIL HCL 2.5 MG/ML IV SOLN
INTRAVENOUS | Status: DC | PRN
  Administered 2019-09-24: 10 mL via INTRA_ARTERIAL

## 2019-09-24 MED ORDER — SODIUM CHLORIDE 0.9 % IV SOLN
INTRAVENOUS | Status: AC | PRN
  Administered 2019-09-24: 10 mL/h via INTRAVENOUS
  Administered 2019-09-24: 250 mL

## 2019-09-24 MED ORDER — DOBUTAMINE IN D5W 4-5 MG/ML-% IV SOLN: 2.5000 ug/kg/min | INTRAVENOUS | Status: DC

## 2019-09-24 MED ORDER — NITROGLYCERIN 0.4 MG SL SUBL: 0.4000 mg | SUBLINGUAL_TABLET | SUBLINGUAL | Status: DC | PRN

## 2019-09-24 MED ORDER — ONDANSETRON HCL 4 MG/2ML IJ SOLN: 4.0000 mg | Freq: Four times a day (QID) | INTRAMUSCULAR | Status: DC | PRN

## 2019-09-24 MED ORDER — HYDRALAZINE HCL 20 MG/ML IJ SOLN: 10.0000 mg | INTRAMUSCULAR | Status: AC | PRN

## 2019-09-24 MED ORDER — ONDANSETRON HCL 4 MG/2ML IJ SOLN
INTRAMUSCULAR | Status: DC | PRN
  Administered 2019-09-24: 4 mg via INTRAVENOUS

## 2019-09-24 MED ORDER — PANTOPRAZOLE SODIUM 40 MG PO TBEC
40.0000 mg | DELAYED_RELEASE_TABLET | Freq: Every day | ORAL | Status: DC
  Administered 2019-09-24 – 2019-09-27 (×4): 40 mg via ORAL
  Filled 2019-09-24 (×4): qty 1

## 2019-09-24 MED ORDER — HYDRALAZINE HCL 20 MG/ML IJ SOLN: 10.0000 mg | INTRAMUSCULAR | Status: DC | PRN

## 2019-09-24 MED ORDER — FENTANYL CITRATE (PF) 100 MCG/2ML IJ SOLN
50.0000 ug | Freq: Once | INTRAMUSCULAR | Status: DC
  Filled 2019-09-24: qty 2

## 2019-09-24 MED ORDER — FENTANYL CITRATE (PF) 100 MCG/2ML IJ SOLN
INTRAMUSCULAR | Status: AC
  Filled 2019-09-24: qty 2

## 2019-09-24 MED ORDER — ENOXAPARIN SODIUM 40 MG/0.4ML ~~LOC~~ SOLN
40.0000 mg | SUBCUTANEOUS | Status: DC
  Administered 2019-09-25: 40 mg via SUBCUTANEOUS
  Filled 2019-09-24 (×3): qty 0.4

## 2019-09-24 MED ORDER — HEPARIN SODIUM (PORCINE) 1000 UNIT/ML IJ SOLN
INTRAMUSCULAR | Status: AC
  Filled 2019-09-24: qty 1

## 2019-09-24 MED ORDER — MIDAZOLAM HCL 2 MG/2ML IJ SOLN
INTRAMUSCULAR | Status: AC
  Filled 2019-09-24: qty 2

## 2019-09-24 MED ORDER — ALPRAZOLAM 0.25 MG PO TABS: 0.2500 mg | ORAL_TABLET | Freq: Two times a day (BID) | ORAL | Status: DC | PRN

## 2019-09-24 MED ORDER — HEPARIN (PORCINE) IN NACL 1000-0.9 UT/500ML-% IV SOLN
INTRAVENOUS | Status: DC | PRN
  Administered 2019-09-24 (×2): 500 mL

## 2019-09-24 SURGICAL SUPPLY — 19 items
CATH 5FR JL3.5 JR4 ANG PIG MP (CATHETERS) ×2 IMPLANT
CATH INFINITI 5FR AL1 (CATHETERS) ×2 IMPLANT
CATH INFINITI 5FR JK (CATHETERS) ×2 IMPLANT
CATH INFINITI 6F MPA2 100CM (CATHETERS) ×2 IMPLANT
CATH LAUNCHER 5F EBU3.0 (CATHETERS) ×1 IMPLANT
CATH S G BIP PACING (CATHETERS) ×2 IMPLANT
CATHETER LAUNCHER 5F EBU3.0 (CATHETERS) ×2
DEVICE CLOSURE MYNXGRIP 5F (Vascular Products) ×2 IMPLANT
DEVICE RAD COMP TR BAND LRG (VASCULAR PRODUCTS) ×2 IMPLANT
GLIDESHEATH SLEND SS 6F .021 (SHEATH) ×2 IMPLANT
GUIDEWIRE INQWIRE 1.5J.035X260 (WIRE) ×1 IMPLANT
INQWIRE 1.5J .035X260CM (WIRE) ×2
KIT HEART LEFT (KITS) ×2 IMPLANT
KIT MICROPUNCTURE NIT STIFF (SHEATH) ×2 IMPLANT
PACK CARDIAC CATHETERIZATION (CUSTOM PROCEDURE TRAY) ×2 IMPLANT
SHEATH PINNACLE 5F 10CM (SHEATH) ×2 IMPLANT
SHEATH PINNACLE 6F 10CM (SHEATH) ×2 IMPLANT
TRANSDUCER W/STOPCOCK (MISCELLANEOUS) ×2 IMPLANT
TUBING CIL FLEX 10 FLL-RA (TUBING) ×2 IMPLANT

## 2019-09-24 NOTE — ED Triage Notes (Signed)
Pt here c/o CP since 5pm yesterday afternoon. Pt states pain comes and goes. Pt given 324 ASA and 1 nitro by EMS with some pain relief.

## 2019-09-24 NOTE — Interval H&P Note (Signed)
History and Physical Interval Note:  09/24/2019 11:54 AM  Gregory Reeves  has presented today for surgery, with the diagnosis of bradycardia and chest pain.  The various methods of treatment have been discussed with the patient and family. After consideration of risks, benefits and other options for treatment, the patient has consented to  Procedure(s): LEFT HEART CATH AND CORONARY ANGIOGRAPHY (N/A) as a surgical intervention.  The patient's history has been reviewed, patient examined, no change in status, stable for surgery.  I have reviewed the patient's chart and labs.  Questions were answered to the patient's satisfaction.    Cath Lab Visit (complete for each Cath Lab visit)  Clinical Evaluation Leading to the Procedure:   ACS: Yes.    Non-ACS:  N/A  Arali Somera

## 2019-09-24 NOTE — Consult Note (Addendum)
ELECTROPHYSIOLOGY CONSULT NOTE    Patient ID: Gregory Reeves MRN: [id number], DOB/AGE: [birth year] 29 y.o.  Admit date: 09/24/2019 Date of Consult: 09/24/2019  Primary Physician: Patient, No Pcp Per Primary Cardiologist: Dina Rich, MD  Electrophysiologist: New   Referring Provider: Dr. Wyline Mood  Patient Profile: Gregory Reeves is a 29 y.o. male with a history of GERD, chronic diarrhea, ADHD as a child, family history of premature CAD in his paternal grandfather,  who is being seen today for the evaluation of sinus arrest at the request of Dr. Wyline Mood.  HPI:  Gregory Reeves is a 29 y.o. male with medical history above.     Pt presented to [facility] with substernal and pleuritic chest pain. Her reporst yesterday evening he was sitting at his desk when he started to have substernal chest pain. He reported it was on the lower edge of his left side, sharp, with no significant radiation. Initially 2/10 pain worse with deep breathing. It became constant, and gradually worsened throughout the night. It was worse lying down, a little bit better sitting up.  By 3 a.m., he felt bad he had to get something done.  When he went to wake his wife, he had a brief syncopal episode.  He sort of crumpled to the ground.  He does not remember getting to the ground.  She had to help him up, he could not get up unaided.  They called 911.  EMS gave him ASA 324 mg and sublingual nitroglycerin x1.  He feels the nitroglycerin helped his chest pain.  However, it did not relieve it.  He has had fentanyl in the ER with some improvement in his pain.  Currently in the ER, his chest pain is still a 5/10.  He states it would help a bit if he clenched his fist to his chest and pressed very hard.  He has chest wall soreness from that.  In the ER, he had an episode of syncope that was associated with bradycardia and 10-second pause.     Per notes, pt had another episode of syncope associate with progressive bradycardia  and 27 seconds of asystole that resolved without intervention. Prior to this episodes he became very diaphoretic and dizzy. He had an additional episode of junctional bradycardia in the 30s with near syncope.    He was brought urgently to [facility] for cath with his symptoms of chest pain and above.  LHC formal read pending, but without significant coronary artery disease.   He continues to have substernal chest pain that is worse with lying flat and with deep breathing. He is pending cMRI.    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 Ace Gins distal TI (5  cm) cobblestoning and tiny erosions, biopsies benign without evidence of IBD   COLONOSCOPY N/A 04/28/2015   RMR: Minimal anal canal hemorrhoids. patchy erythema of the rectum may or may not be significant. 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    ESOPHAGOGASTRODUODENOSCOPY  01/14/2011   Dr. Elmer Ramp EGD, benign small bowel biopsy     Medications Prior to Admission  Medication Sig Dispense Refill Last Dose   acetaminophen (TYLENOL) 325 MG tablet Take 325 mg by mouth every 6 (six) hours as needed for headache.   09/20/2019    Inpatient  Medications:   [START ON 09/25/2019] enoxaparin (LOVENOX) injection  40 mg Subcutaneous Q24H   fentaNYL (SUBLIMAZE) injection  50 mcg Intravenous Once   sodium chloride flush  3 mL Intravenous Q12H   sodium chloride flush  3 mL Intravenous Q12H    Allergies: No Known Allergies  Social History   Socioeconomic History   Marital status: Married    Spouse name: Not on file   Number of children: 0   Years of education: Not on file   Highest education level: Not on file  Occupational History   Occupation: [employer]    Comment:    Tobacco Use   Smoking status: Never Smoker   Smokeless tobacco: Never Used  Substance and Sexual Activity   Alcohol use: No    Alcohol/week: 0.0 standard drinks   Drug use: No   Sexual activity: Yes  Other Topics Concern   Not on file  Social History Narrative   Lives with both parents   Social Determinants of Health   Financial Resource Strain:    Difficulty of Paying Living Expenses:   Food Insecurity:    Worried About Programme researcher, broadcasting/film/video in the Last Year:    Barista in the Last Year:   Transportation Needs:    Freight forwarder (Medical):    Lack of Transportation (Non-Medical):   Physical Activity:    Days of Exercise per Week:    Minutes of Exercise per Session:   Stress:    Feeling of Stress :   Social Connections:    Frequency of Communication with Friends and Family:    Frequency of Social Gatherings with Friends and Family:    Attends Religious Services:    Active Member of Clubs or Organizations:    Attends Engineer, structural:    Marital Status:   Intimate Partner Violence:    Fear of Current or Ex-Partner:    Emotionally Abused:    Physically Abused:    Sexually Abused:      Family History  Problem Relation Age of Onset   Hypertension Father    Colon polyps Maternal Grandmother 40       partial colectomy secondary to numerous polyps   Stomach cancer Maternal  Grandfather 11       deceased   Heart disease Paternal Grandfather        late 15s, early 30s   Crohn's disease Neg Hx    Liver disease Neg Hx      Review of Systems: All other systems reviewed and are otherwise negative except as noted above.  Physical Exam: Vitals:   09/24/19 1255 09/24/19 1300 09/24/19 1305 09/24/19 1338  BP: (!) 124/91 (!) 136/94 123/77   Pulse: 83 85 82   Resp: 17 17 16    Temp:      TempSrc:  SpO2: 99% 98% 98% 98%  Weight:      Height:        GEN- The patient is fatigued appearing, alert and oriented x 3 today.   HEENT: normocephalic, atraumatic; sclera clear, conjunctiva pink; hearing intact; oropharynx clear; neck supple Lungs- Clear to ausculation bilaterally, normal work of breathing.  No wheezes, rales, rhonchi Heart- Regular rate and rhythm, no murmurs, rubs or gallops GI- soft, non-tender, non-distended, bowel sounds present Extremities- no clubbing, cyanosis, or edema; DP/PT/radial pulses 2+ bilaterally MS- no significant deformity or atrophy Skin- warm and dry, no rash or lesion Psych- euthymic mood, full affect Neuro- strength and sensation are intact  Labs:   Lab Results  Component Value Date   WBC 12.8 (H) 09/24/2019   HGB 16.7 09/24/2019   HCT 49.0 09/24/2019   MCV 85.5 09/24/2019   PLT 264 09/24/2019    Recent Labs  Lab 09/24/19 0534 09/24/19 0849  NA 140  --   K 4.0  --   CL 109  --   CO2 23  --   BUN 11  --   CREATININE 0.80  --   CALCIUM 9.0  --   PROT  --  6.8  BILITOT  --  0.9  ALKPHOS  --  96  ALT  --  25  AST  --  18  GLUCOSE 118*  --       Radiology/Studies: DG Chest 2 View  Result Date: 09/24/2019 CLINICAL DATA:  Chest pain.  History of asthma. EXAM: CHEST - 2 VIEW COMPARISON:  10/26/2012 FINDINGS: The cardiac silhouette is accentuated by decreased lung volumes and is likely within normal limits. There is new peribronchial thickening. No confluent airspace opacity, overt pulmonary edema, pleural  effusion, pneumothorax is identified. No acute osseous abnormality is seen. IMPRESSION: Peribronchial thickening which may reflect reactive airways disease or bronchitis. Electronically Signed   By: Sebastian Ache M.D.   On: 09/24/2019 04:43   ECHOCARDIOGRAM LIMITED  Result Date: 09/24/2019    ECHOCARDIOGRAM LIMITED REPORT   Patient Name:   Gregory Reeves Date of Exam: 09/24/2019 Medical Rec #:  [id number]        Height:       68.0 in Accession #:    [id number]       Weight:       230.0 lb Date of Birth:  [birth year]         BSA:          2.169 m Patient Age:    29 years         BP:           117/84 mmHg Patient Gender: M                HR:           107 bpm. [facility] Procedure: Limited Echo, Cardiac Doppler and Color Doppler STAT ECHO Indications:    Pericardial effusion 423.9 / I31.3  History:        Patient has no prior history of Echocardiogram examinations.                 Signs/Symptoms:Shortness of Breath and Chest Pain. GERD.  Sonographer:    Celesta Gentile RCS Referring Phys: [phone] Zadie Rhine IMPRESSIONS  1. Left ventricular ejection fraction, by estimation, is 60 to 65%. The left ventricle has normal function. The left ventricle has no regional wall motion abnormalities.  2. Right ventricular systolic function is normal.  The right ventricular size is normal.  3. The inferior vena cava is normal in size with greater than 50% respiratory variability, suggesting right atrial pressure of 3 mmHg.  4. Limited echo FINDINGS  Left Ventricle: Left ventricular ejection fraction, by estimation, is 60 to 65%. The left ventricle has normal function. The left ventricle has no regional wall motion abnormalities. The left ventricular internal cavity size was normal in size. There is  no left ventricular hypertrophy. Right Ventricle: The right ventricular size is normal. No increase in right ventricular wall thickness. Right ventricular systolic function is normal. Pericardium: There is no evidence of  pericardial effusion. Aorta: The aortic root is normal in size and structure. Venous: The inferior vena cava is normal in size with greater than 50% respiratory variability, suggesting right atrial pressure of 3 mmHg. RIGHT VENTRICLE TAPSE (M-mode): 2.0 cm  AORTA Ao Root diam: 3.00 cm Dina RichJonathan Branch MD Electronically signed by Dina RichJonathan Branch MD Signature Date/Time: 09/24/2019/12:36:15 PM    Final     EKG: this am shows NSR 88 bpm with QRS 99 ms (personally reviewed)  TELEMETRY: Reviewed. Currently NSR 80-90s. [facility] shows an approximately 27 second pause ~0814 this am  (personally reviewed)  Assessment/Plan: 1.  Sinus arrest Episodes appears to slow and then no clear p or r waves are seen for approx 27 seconds.  No AV nodal blocking agents.  Plan cMRI for investigate for cardiomyopathy.  L/RHC with no significant CAD K 4.1. HS trop 3. WBC 12.8, TSH 2.2. D.dimer negative.  UDS + for benzos but otherwise negative.  2. ? Pericarditis Sed rate negative, but continues to have pleuritic chest pain Maryuri Warnke treat empirically with colchicine 0.6 mg BID   For questions or updates, please contact [facility] Please consult [email or web address] for contact info under Cardiology/STEMI.  Signed, Graciella FreerMichael Andrew Tillery, PA-C  09/24/2019 1:59 PM      I have seen and examined this patient with Otilio SaberAndy Tillery.  Agree with above, note added to reflect my findings.  On exam, RRR, no murmurs, lungs clear.  Patient presented to the hospital after an episode of near syncope while at home.  He states that he was having chest pain and some shortness of breath prior to his episode.  He was walking into the bedroom to see his wife when he became near syncopal and went to the ground.  According to his wife, he was conversing the entire time.  Presented to the hospital and had to episodes of syncope all associated with significant bradycardia on cardiac monitoring.  The longest episode lasted 27  seconds of asystole.  He did have slowing of his rhythm prior to this which could be a vagal episode.  He describes diaphoretic symptoms prior to his episodes in the emergency room.  [facility] and had a left heart catheterization which showed no evidence of coronary disease.  He does have a strong family history of coronary artery disease.  We Hayde Kilgour get a cardiac MRI to see if there is any inflammation that could explain his bradycardia.  Clinically, it sounds like he has pericarditis and Olevia Westervelt treat him with 0.6 mg of colchicine twice daily.  If he does not have further pauses, hopefully we can avoid pacemaker implant, and this can all be related to perimyocarditis.  We Annalis Kaczmarczyk continue to assess through the weekend.  Aralynn Brake M. Mashawn Brazil MD 09/24/2019 3:31 PM

## 2019-09-24 NOTE — Brief Op Note (Signed)
BRIEF CARDIAC CATHETERIZATION NOTE  09/24/2019  1:18 PM  PATIENT:  Gregory Reeves  29 y.o. male  PRE-OPERATIVE DIAGNOSIS:  Chest pain and sinus node dysfunction  POST-OPERATIVE DIAGNOSIS:  Same  PROCEDURE:  Procedure(s): LEFT HEART CATH AND CORONARY ANGIOGRAPHY (N/A)  SURGEON:  Surgeon(s) and Role:    Nelva Bush, MD - Primary  FINDINGS: 1. No angiographically significant coronary artery disease. 2. Low normal left ventricular filling pressure. 3. Short aortic root precluding catheter engagement of the left coronary artery from the right radial approach.  RECOMMENDATIONS: 1. Admit to 2-Heart ICU. 2. Cardiac MRI and EP consultation. 3. Check ESR and CRP.  Nelva Bush, MD [facility]

## 2019-09-24 NOTE — H&P (View-Only) (Signed)
Cardiology Consultation:   Patient ID: Gregory EvesJustin C Reeves; [id number]; 07/25/1990   Admit date: 09/24/2019 Date of Consult: 09/24/2019  Primary Care Provider: Patient, No Pcp Per Primary Cardiologist: Dina RichBranch, Michal Callicott, MD New Primary Electrophysiologist:  None   Patient Profile:   Gregory Reeves is a 29 y.o. male with a hx of GERD, chronic diarrhea, ADHD as a child, family history of premature coronary artery disease in his paternal grandfather, who is being seen today for the evaluation of chest pain and asystole at the request of Dr. Estell HarpinZammit.  History of Present Illness:   Mr. Gregory Reeves had an episode of syncope in the fourth grade.  It was attributed to dehydration.  He had problems with dizziness once as a young adult and was diagnosed with vertigo.  Other than that, no history of syncope and no history of chest pain.  Last p.m., he was sitting at his desk working, and had onset of substernal chest pain.  It is at the lower edge of his sternum on the left side.  It is sharp.  No significant radiation.  It hurts worse to take a deep breath.  It was initially a 2/10.  It was initially intermittent, but over the course of the evening, it became constant.  It also got worse.  By 2 AM, it was a 10/10.  He struggled with it all night.  He was not able to sleep.  It was worse lying down, a little bit better sitting up.  By 3 a.m., he decided he had to get something done.  When he went to wake his wife, he had a brief syncopal episode.  He sort of crumpled to the ground.  He does not remember getting to the ground.  She had to help him up, he could not get up unaided.  They called 911.  EMS gave him ASA 324 mg and sublingual nitroglycerin x1.  He feels the nitroglycerin helped his chest pain.  However, it did not relieve it.  He has had fentanyl in the ER with some improvement in his pain.  Currently in the ER, his chest pain is still a 5/10.  He states it would help a bit if he clenched his fist to  his chest and pressed very hard.  He has chest wall soreness from that.  In the ER, he had an episode of syncope that was associated with bradycardia and 10-second pause.  He has never had anything like this before.  His paternal grandfather was diagnosed with having had a heart attack in his late 4920s or early 6430s.  No other family history of premature coronary artery disease and no history of any family member needing a pacemaker or other device.  He has chronic diarrhea, has had it for years.  He does not have a diagnosis associated with this.  He has not seen GI in several years.  He sometimes sees blood on the tissue and sometimes there is blood in the toilet as well.  He chronically has 6 or 7 bowel movements a day.   Past Medical History:  Diagnosis Date  . ADHD (attention deficit hyperactivity disorder)    as a child, does not require meds  . Allergic rhinitis 06/29/2012  . Asthma    h/o, no meds  . Diarrhea   . GERD (gastroesophageal reflux disease)   . Ileitis 01/2011   biopsies negative for IBD  . Symptomatic bradycardia 09/24/2019    Past Surgical History:  Procedure Laterality Date  .  ABDOMINAL EXPLORATION SURGERY  08/02/2010   [facility] hospital  . 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 BIOPSY N/A 04/28/2015   Procedure: BIOPSY;  Surgeon: Corbin Ade, MD;  [facility];  Service: Endoscopy;  Laterality: N/A;  Terminal ileum biopsies and Rectal biopsies via cold bipsy forceps  . COLONOSCOPY  01/14/2011   Dr. Ace Gins distal TI (5 cm) cobblestoning and tiny erosions, biopsies benign without evidence of IBD  . COLONOSCOPY N/A 04/28/2015   RMR: Minimal anal canal hemorrhoids. patchy erythema of the rectum may or may not be significant. 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   Marland Kitchen ESOPHAGOGASTRODUODENOSCOPY  01/14/2011   Dr. Elmer Ramp EGD, benign small bowel  biopsy     Prior to Admission medications   None    Inpatient Medications: Scheduled Meds: . fentaNYL (SUBLIMAZE) injection  50 mcg Intravenous Once   Continuous Infusions:  PRN Meds:   Allergies:   No Known Allergies  Social History:   Social History   Socioeconomic History  . Marital status: Married    Spouse name: Not on file  . Number of children: 0  . Years of education: Not on file  . Highest education level: Not on file  Occupational History  . Occupation: [employer]    Comment:    Tobacco Use  . Smoking status: Never Smoker  . Smokeless tobacco: Never Used  Substance and Sexual Activity  . Alcohol use: No    Alcohol/week: 0.0 standard drinks  . Drug use: No  . Sexual activity: Yes  Other Topics Concern  . Not on file  Social History Narrative   Lives with both parents   Social Determinants of Health   Financial Resource Strain:   . Difficulty of Paying Living Expenses:   Food Insecurity:   . Worried About Programme researcher, broadcasting/film/video in the Last Year:   . Barista in the Last Year:   Transportation Needs:   . Freight forwarder (Medical):   Marland Kitchen Lack of Transportation (Non-Medical):   Physical Activity:   . Days of Exercise per Week:   . Minutes of Exercise per Session:   Stress:   . Feeling of Stress :   Social Connections:   . Frequency of Communication with Friends and Family:   . Frequency of Social Gatherings with Friends and Family:   . Attends Religious Services:   . Active Member of Clubs or Organizations:   . Attends Banker Meetings:   Marland Kitchen Marital Status:   Intimate Partner Violence:   . Fear of Current or Ex-Partner:   . Emotionally Abused:   Marland Kitchen Physically Abused:   . Sexually Abused:     Family History:   Family History  Problem Relation Age of Onset  . Hypertension Father   . Colon polyps Maternal Grandmother 40       partial colectomy secondary to numerous polyps  . Stomach cancer Maternal Grandfather 39        deceased  . Heart disease Paternal Grandfather        late 63s, early 18s  . Crohn's disease Neg Hx   . Liver disease Neg Hx    Family Status:  Family Status  Relation Name Status  . Mother  Alive  . Father  Alive  . Brother  Alive  . MGM  Deceased  . MGF  Deceased  . PGF  Deceased  .  Neg Hx  (Not Specified)    ROS:  Please see the history of present illness.  All other ROS reviewed and negative.     Physical Exam/Data:   Vitals:   09/24/19 0345 09/24/19 0500 09/24/19 0800 09/24/19 0830  BP: (!) 101/51 118/83 117/84 (!) 129/90  Pulse: 92 87 103 105  Resp: 18 19 19  (!) 27  Temp: 97.8 F (36.6 C)     TempSrc: Oral     SpO2: 97% 96% 97% 94%  Weight:      Height:       No intake or output data in the 24 hours ending 09/24/19 0943  Last 3 Weights 09/24/2019 04/28/2015 04/04/2015  Weight (lbs) 230 lb 200 lb 205 lb 12.8 oz  Weight (kg) 104.327 kg 90.719 kg 93.35 kg     Body mass index is 34.97 kg/m.   General:  Well nourished, well developed, male in no acute distress HEENT: normal Lymph: no adenopathy Neck: JVD -not elevated Endocrine:  No thryomegaly Vascular: No carotid bruits; 4/4 extremity pulses 2+  Cardiac:  normal S1, S2; RRR; no murmur Lungs:  clear bilaterally, no wheezing, rhonchi or rales  Abd: soft, nontender, no hepatomegaly  Ext: no edema Musculoskeletal:  No deformities, BUE and BLE strength normal and equal Skin: warm and dry  Neuro:  CNs 2-12 intact, no focal abnormalities noted Psych:  Normal affect   EKG:  The EKG was personally reviewed and demonstrates: Initial ECG is sinus rhythm, heart rate 86, possible early repole in the inferior leads Repeat ECG is sinus rhythm, heart rate 88, T wave inversions in V1-3 Telemetry:  Telemetry was personally reviewed and demonstrates: Initially sinus tachycardia, gradual slowing to significant sinus bradycardia and a 10-second pause with resumption of sinus rhythm.   CV studies:   ECHO: Final report  pending, initial report is EF 55--60% with no wall motion abnormalities  Laboratory Data:   Chemistry Recent Labs  Lab 09/24/19 0534  NA 140  K 4.0  CL 109  CO2 23  GLUCOSE 118*  BUN 11  CREATININE 0.80  CALCIUM 9.0  GFRNONAA >60  GFRAA >60  ANIONGAP 8    Lab Results  Component Value Date   ALT 22 01/19/2015   AST 15 01/19/2015   ALKPHOS 91 01/19/2015   BILITOT 1.1 01/19/2015   Hematology Recent Labs  Lab 09/24/19 0534  WBC 12.8*  RBC 5.73  HGB 16.7  HCT 49.0  MCV 85.5  MCH 29.1  MCHC 34.1  RDW 12.9  PLT 264   Cardiac Enzymes High Sensitivity Troponin:   Recent Labs  Lab 09/24/19 0534 09/24/19 0725  TROPONINIHS 3 3      BNPNo results for input(s): BNP, PROBNP in the last 168 hours.  DDimer  Recent Labs  Lab 09/24/19 0534  DDIMER <0.27   TSH:  Lab Results  Component Value Date   TSH 2.200 09/24/2019   Lipids:No results found for: CHOL, HDL, LDLCALC, LDLDIRECT, TRIG, CHOLHDL HgbA1c:No results found for: HGBA1C Magnesium:  Magnesium  Date Value Ref Range Status  09/24/2019 2.0 1.7 - 2.4 mg/dL Final    Comment:    Performed at [facility], [address], [place], [place]   Lab Results  Component Value Date   ESRSEDRATE 1 01/19/2015      Component Value Date/Time   CRP <0.5 01/19/2015 01/21/2015     Radiology/Studies:  DG Chest 2 View  Result Date: 09/24/2019 CLINICAL DATA:  Chest pain.  History of  asthma. EXAM: CHEST - 2 VIEW COMPARISON:  10/26/2012 FINDINGS: The cardiac silhouette is accentuated by decreased lung volumes and is likely within normal limits. There is new peribronchial thickening. No confluent airspace opacity, overt pulmonary edema, pleural effusion, pneumothorax is identified. No acute osseous abnormality is seen. IMPRESSION: Peribronchial thickening which may reflect reactive airways disease or bronchitis. Electronically Signed   By: Sebastian Ache M.D.   On: 09/24/2019 04:43    Assessment and Plan:   1.  Chest  pain: -He has family history of premature coronary artery disease in his paternal grandfather -His ECG has T wave inversions that were not present on the initial ECG -Cardiac enzymes are negative x2 -However, his pain has many atypical features in that it started at rest, is worse with deep inspiration, and made worse by lying supine -However, with the arrhythmia, he likely needs an ischemic evaluation. -Sed rate and CRP are pending -Screen for cardiac risk factors with hepatic function panel, lipid panel, A1c  2.  Asystole, symptomatic bradycardia -Video taken from the telemetry in the ER was sent to EP -[facility] has been contacted for transfer to an ICU bed  3.  Chronic diarrhea -As needed medications, the patient states that he had an extensive work-up by GI but with no clear diagnosis and the medicines they tried did not help -MD advise on GI eval and as inpatient or as outpatient   Principal Problem:   Chest pain, moderate coronary artery risk Active Problems:   Symptomatic bradycardia   Sinus pause     For questions or updates, please contact [facility] Please consult [email or web address] for contact info under Cardiology/STEMI.   Melida Quitter, PA-C  09/24/2019 9:43 AM  Attending note  Patient seen and discussed with PA Barrett, I agree with her documentation. 29 yo male history of ADHD presented with pleuritic chest pain. Worst with laying down, better with leaning forward.   While in ER episode of syncope captured on telemetry. Progressive bradycardia followed by asystole 27 seconds, self resolved. Prior to episode very diaphoretic, dizzy. Repeat episode of junctional brady to the 30s transient with near syncope in ER, self resolved. EKG SR, dynamic anteiror TWIs, very mild inferior upsloping ST elevation, mild PR elevation aVR.  He reports paternal grandfather with MI in his early 26s.    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  With his family history of early CAD, combined with some dynamic anterior T waves changes cannot ischemic heart disease. With recurrent episodes will send to cath lab for temp wire and also LHC to exlucde CAD. If CAD excluded and inflammatory markers elevated would plan to treat as possible pericardits. Discussed with Dr Elberta Fortis who agrees with cath and then potential MIR.  Trops neg thus far. Starting on dobutamine to support heart rates, being transferred directly to cath lab.   Dina Rich MD

## 2019-09-24 NOTE — ED Notes (Signed)
Dr. Bebe Shaggy advised we did not have to walk patient.

## 2019-09-24 NOTE — ED Notes (Signed)
Pt's visitor came out of the room and yelled "help"! This nurse went into patient's room and patient was very diaphoretic, but was alert. She stated that his HR went to 0 and he had snoring respirations. Notified Dr. Estell Harpin and printed rhythm strip. Asystole showed on rhythm strip. EKG given to Dr. Estell Harpin. Crash cart outside of room. Will apply pads once echo is done.

## 2019-09-24 NOTE — ED Notes (Signed)
Advised patient we needed urine specimen. 

## 2019-09-24 NOTE — Progress Notes (Addendum)
*  PRELIMINARY RESULTS* Echocardiogram Limited 2-D Echocardiogram has been performed.  Stacey Drain 09/24/2019, 8:43 AM

## 2019-09-24 NOTE — ED Notes (Signed)
Report to [facility]link.  

## 2019-09-24 NOTE — ED Provider Notes (Signed)
[facility] EMERGENCY DEPARTMENT Provider Note   CSN: [id number] Arrival date & time: 09/24/19  2202     History Chief Complaint  Patient presents with  . Chest Pain    Gregory Reeves is a 29 y.o. male.  The history is provided by the patient.  Chest Pain Pain location:  Substernal area Pain quality: sharp   Radiates to: shoulders. Pain severity:  Severe Onset quality:  Sudden Duration:  12 hours Timing:  Intermittent Progression:  Worsening Chronicity:  New Context: breathing   Relieved by:  Nothing Worsened by:  Deep breathing and certain positions Associated symptoms: shortness of breath   Associated symptoms: no cough, no fever and no vomiting   Risk factors: immobilization   Risk factors: no coronary artery disease, no prior DVT/PE and no smoking   Patient with history of asthma, GERD, ADHD, distant history of Crohn's presents with chest pain.  He reports that the past several days has had the feeling of heartburn.  However approximately 12 hours ago he had sudden onset of sharp chest pain that is worse with breathing that will radiate to his shoulders.  He reports this occurred at rest.  He had otherwise been well. He reports frequent travel to [place] that is a 6-hour car trip for his work No previous history of CAD/VTE    Past Medical History:  Diagnosis Date  . ADHD (attention deficit hyperactivity disorder)    does not require meds  . Allergic rhinitis 06/29/2012  . Asthma    h/o, no meds  . Diarrhea   . GERD (gastroesophageal reflux disease)   . Ileitis 01/2011   biopsies negative for IBD    Patient Active Problem List   Diagnosis Date Noted  . Abnormality of rectum   . Rectal bleeding 12/05/2014  . Allergic rhinitis 06/29/2012  . Lower abdominal pain 07/09/2011  . GERD (gastroesophageal reflux disease) 01/07/2011  . RLQ abdominal pain 07/18/2010  . Diarrhea 07/18/2010    Past Surgical History:  Procedure Laterality Date  . ABDOMINAL  EXPLORATION SURGERY  08/02/2010   [facility] hospital  . 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 BIOPSY N/A 04/28/2015   Procedure: BIOPSY;  Surgeon: Corbin Ade, MD;  Location: [facility];  Service: Endoscopy;  Laterality: N/A;  Terminal ileum biopsies and Rectal biopsies via cold bipsy forceps  . COLONOSCOPY  01/14/2011   Dr. Ace Gins distal TI (5 cm) cobblestoning and tiny erosions, biopsies benign without evidence of IBD  . COLONOSCOPY N/A 04/28/2015   RMR: Minimal anal canal hemorrhoids. patchy erythema of the rectum may or may not be significant. 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   Marland Kitchen ESOPHAGOGASTRODUODENOSCOPY  01/14/2011   Dr. Elmer Ramp EGD, benign small bowel biopsy       Family History  Problem Relation Age of Onset  . Colon polyps Maternal Grandmother 40       partial colectomy secondary to numerous polyps  . Stomach cancer Maternal Grandfather 85       deceased  . Crohn's disease Neg Hx   . Liver disease Neg Hx     Social History   Tobacco Use  . Smoking status: Never Smoker  Substance Use Topics  . Alcohol use: No    Alcohol/week: 0.0 standard drinks  . Drug use: No    Home Medications Prior to Admission medications   Not on File    Allergies  Patient has no known allergies.  Review of Systems   Review of Systems  Constitutional: Negative for fever.  Respiratory: Positive for shortness of breath. Negative for cough.   Cardiovascular: Positive for chest pain. Negative for leg swelling.  Gastrointestinal: Positive for diarrhea. Negative for blood in stool and vomiting.       Chronic diarrhea-nonbloody  All other systems reviewed and are negative.   Physical Exam Updated Vital Signs BP (!) 101/51 (BP Location: Left Arm)   Pulse 92   Temp 97.8 F (36.6 C) (Oral)   Resp 18   Ht 1.727 m (5\' 8" )   Wt (!) 104.3 kg   SpO2 97%   BMI 34.97  kg/m   Physical Exam CONSTITUTIONAL: Well developed/well nourished, anxious HEAD: Normocephalic/atraumatic EYES: EOMI/PERRL ENMT: Mucous membranes moist NECK: supple no meningeal signs SPINE/BACK:entire spine nontender CV: S1/S2 noted, no murmurs/rubs/gallops noted, tachycardic LUNGS: Lungs are clear to auscultation bilaterally, no apparent distress Chest-mild tenderness to central chest, no crepitus ABDOMEN: soft, nontender, no rebound or guarding, bowel sounds noted throughout abdomen GU:no cva tenderness NEURO: Pt is awake/alert/appropriate, moves all extremitiesx4.  No facial droop.   EXTREMITIES: pulses normal/equal, full ROM, no calf tenderness or edema SKIN: warm, color normal PSYCH: Anxious  ED Results / Procedures / Treatments   Labs (all labs ordered are listed, but only abnormal results are displayed) Labs Reviewed  BASIC METABOLIC PANEL - Abnormal; Notable for the following components:      Result Value   Glucose, Bld 118 (*)    All other components within normal limits  CBC WITH DIFFERENTIAL/PLATELET - Abnormal; Notable for the following components:   WBC 12.8 (*)    Neutro Abs 9.9 (*)    All other components within normal limits  SARS CORONAVIRUS 2 BY RT PCR (HOSPITAL ORDER, PERFORMED IN [facility])  D-DIMER, QUANTITATIVE (NOT AT [facility])  TROPONIN I (HIGH SENSITIVITY)  TROPONIN I (HIGH SENSITIVITY)    EKG EKG Interpretation  Date/Time:  Friday September 24 2019 03:51:26 EDT Ventricular Rate:  86 PR Interval:    QRS Duration: 95 QT Interval:  342 QTC Calculation: 409 R Axis:   72 Text Interpretation: Sinus rhythm Baseline wander in lead(s) V5 V6 T wave inversion No previous ECGs available Confirmed by [id number] (Zadie Rhine) on 09/24/2019 3:56:15 AM   Radiology DG Chest 2 View  Result Date: 09/24/2019 CLINICAL DATA:  Chest pain.  History of asthma. EXAM: CHEST - 2 VIEW COMPARISON:  10/26/2012 FINDINGS: The cardiac silhouette is accentuated by  decreased lung volumes and is likely within normal limits. There is new peribronchial thickening. No confluent airspace opacity, overt pulmonary edema, pleural effusion, pneumothorax is identified. No acute osseous abnormality is seen. IMPRESSION: Peribronchial thickening which may reflect reactive airways disease or bronchitis. Electronically Signed   By: 10/28/2012 M.D.   On: 09/24/2019 04:43    Procedures Procedures  Medications Ordered in ED Medications  fentaNYL (SUBLIMAZE) injection 50 mcg (has no administration in time range)  fentaNYL (SUBLIMAZE) injection 100 mcg (100 mcg Intravenous Given 09/24/19 0535)    ED Course  I have reviewed the triage vital signs and the nursing notes.  Pertinent labs & imaging results that were available during my care of the patient were reviewed by me and considered in my medical decision making (see chart for details).    MDM Rules/Calculators/A&P  7:16 AM Patient presented with sharp chest pain.  It was pleuritic in nature.  He reported a history of long distance travel.  High suspicion for acute PE.  However D-dimer is negative and no hypoxia.  Patient continues to be tachycardic.  He now reports the pain is worse with lying flat and improved with leaning forward.  Wife reports he did have a syncopal episode which triggered the 911 call. Now concerned about pericarditis and potentially pericardial effusion.  He does have low voltage on EKG, ?PR depression Attempted bedside ultrasound but unable to obtain satisfactory imaging I feel he requires a 2D echo prior to discharge. If echocardiogram is negative for effusion, he can be discharged with anti-inflammatories for presumed pericarditis Signed out to Dr. Estell Harpin with 2D echo pending   This patient presents to the ED for concern of chest pain, this involves an extensive number of treatment options, and is a complaint that carries with it a high risk of complications and  morbidity.  The differential diagnosis includes pulmonary embolism, acute coronary syndrome, aortic dissection, pneumonia, pericarditis   Lab Tests:   I Ordered, reviewed, and interpreted labs, which included D-dimer, troponin, electrolytes, complete blood count  Medicines ordered:   I ordered medication fentanyl for chest pain  Imaging Studies ordered:   I ordered imaging studies which included chest x-ray   I independently visualized and interpreted imaging which showed no acute findings  Additional history obtained:   Additional history obtained from wife  Previous records obtained and reviewed   Reevaluation:  After the interventions stated above, I reevaluated the patient and found patient stable    Final Clinical Impression(s) / ED Diagnoses Final diagnoses:  None    Rx / DC Orders ED Discharge Orders    None       Zadie Rhine, MD 09/24/19 [phone]

## 2019-09-24 NOTE — ED Provider Notes (Addendum)
Patient had syncopal episode in the emergency department the monitor showed bradycardia and asystole for about 24 seconds.  When I saw the patient he was back to his normal.  Cardiology was consulted and have seen the patient and are recommending admission to [facility] for further work-up   Bethann Berkshire, MD 09/24/19 1007    Bethann Berkshire, MD 09/24/19 1008

## 2019-09-24 NOTE — ED Notes (Signed)
Pts HR bradycardia 30's about 30 seconds.  Pt unresponsive and diaphoretic.  Placed on O2@ 2L/M via n/c.  Dr Estell Harpin, Theodore Demark, RN and Dr Cecille Po paged.

## 2019-09-24 NOTE — Consult Note (Addendum)
Cardiology Consultation:   Patient ID: Gregory Reeves; [id number]; 12/03/1990   Admit date: 09/24/2019 Date of Consult: 09/24/2019  Primary Care Provider: Patient, No Pcp Per Primary Cardiologist: Pepper Kerrick, MD New Primary Electrophysiologist:  None   Patient Profile:   Gregory Reeves is a 29 y.o. male with a hx of GERD, chronic diarrhea, ADHD as a child, family history of premature coronary artery disease in his paternal grandfather, who is being seen today for the evaluation of chest pain and asystole at the request of Dr. Zammit.  History of Present Illness:   Mr. Standing had an episode of syncope in the fourth grade.  It was attributed to dehydration.  He had problems with dizziness once as a young adult and was diagnosed with vertigo.  Other than that, no history of syncope and no history of chest pain.  Last p.m., he was sitting at his desk working, and had onset of substernal chest pain.  It is at the lower edge of his sternum on the left side.  It is sharp.  No significant radiation.  It hurts worse to take a deep breath.  It was initially a 2/10.  It was initially intermittent, but over the course of the evening, it became constant.  It also got worse.  By 2 AM, it was a 10/10.  He struggled with it all night.  He was not able to sleep.  It was worse lying down, a little bit better sitting up.  By 3 a.m., he decided he had to get something done.  When he went to wake his wife, he had a brief syncopal episode.  He sort of crumpled to the ground.  He does not remember getting to the ground.  She had to help him up, he could not get up unaided.  They called 911.  EMS gave him ASA 324 mg and sublingual nitroglycerin x1.  He feels the nitroglycerin helped his chest pain.  However, it did not relieve it.  He has had fentanyl in the ER with some improvement in his pain.  Currently in the ER, his chest pain is still a 5/10.  He states it would help a bit if he clenched his fist to  his chest and pressed very hard.  He has chest wall soreness from that.  In the ER, he had an episode of syncope that was associated with bradycardia and 10-second pause.  He has never had anything like this before.  His paternal grandfather was diagnosed with having had a heart attack in his late 20s or early 30s.  No other family history of premature coronary artery disease and no history of any family member needing a pacemaker or other device.  He has chronic diarrhea, has had it for years.  He does not have a diagnosis associated with this.  He has not seen GI in several years.  He sometimes sees blood on the tissue and sometimes there is blood in the toilet as well.  He chronically has 6 or 7 bowel movements a day.   Past Medical History:  Diagnosis Date  . ADHD (attention deficit hyperactivity disorder)    as a child, does not require meds  . Allergic rhinitis 06/29/2012  . Asthma    h/o, no meds  . Diarrhea   . GERD (gastroesophageal reflux disease)   . Ileitis 01/2011   biopsies negative for IBD  . Symptomatic bradycardia 09/24/2019    Past Surgical History:  Procedure Laterality Date  .   ABDOMINAL EXPLORATION SURGERY  08/02/2010   [facility]  . 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 BIOPSY N/A 04/28/2015   Procedure: BIOPSY;  Surgeon: Corbin Ade, MD;  Location: [facility];  Service: Endoscopy;  Laterality: N/A;  Terminal ileum biopsies and Rectal biopsies via cold bipsy forceps  . COLONOSCOPY  01/14/2011   Dr. Ace Gins distal TI (5 cm) cobblestoning and tiny erosions, biopsies benign without evidence of IBD  . COLONOSCOPY N/A 04/28/2015   RMR: Minimal anal canal hemorrhoids. patchy erythema of the rectum may or may not be significant. 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   Marland Kitchen ESOPHAGOGASTRODUODENOSCOPY  01/14/2011   Dr. Elmer Ramp EGD, benign small bowel  biopsy     Prior to Admission medications   None    Inpatient Medications: Scheduled Meds: . fentaNYL (SUBLIMAZE) injection  50 mcg Intravenous Once   Continuous Infusions:  PRN Meds:   Allergies:   No Known Allergies  Social History:   Social History   Socioeconomic History  . Marital status: Married    Spouse name: Not on file  . Number of children: 0  . Years of education: Not on file  . Highest education level: Not on file  Occupational History  . Occupation: [employer]    Comment:    Tobacco Use  . Smoking status: Never Smoker  . Smokeless tobacco: Never Used  Substance and Sexual Activity  . Alcohol use: No    Alcohol/week: 0.0 standard drinks  . Drug use: No  . Sexual activity: Yes  Other Topics Concern  . Not on file  Social History Narrative   Lives with both parents   Social Determinants of Health   Financial Resource Strain:   . Difficulty of Paying Living Expenses:   Food Insecurity:   . Worried About Programme researcher, broadcasting/film/video in the Last Year:   . Barista in the Last Year:   Transportation Needs:   . Freight forwarder (Medical):   Marland Kitchen Lack of Transportation (Non-Medical):   Physical Activity:   . Days of Exercise per Week:   . Minutes of Exercise per Session:   Stress:   . Feeling of Stress :   Social Connections:   . Frequency of Communication with Friends and Family:   . Frequency of Social Gatherings with Friends and Family:   . Attends Religious Services:   . Active Member of Clubs or Organizations:   . Attends Banker Meetings:   Marland Kitchen Marital Status:   Intimate Partner Violence:   . Fear of Current or Ex-Partner:   . Emotionally Abused:   Marland Kitchen Physically Abused:   . Sexually Abused:     Family History:   Family History  Problem Relation Age of Onset  . Hypertension Father   . Colon polyps Maternal Grandmother 40       partial colectomy secondary to numerous polyps  . Stomach cancer Maternal Grandfather 39        deceased  . Heart disease Paternal Grandfather        late 63s, early 18s  . Crohn's disease Neg Hx   . Liver disease Neg Hx    Family Status:  Family Status  Relation Name Status  . Mother  Alive  . Father  Alive  . Brother  Alive  . MGM  Deceased  . MGF  Deceased  . PGF  Deceased  .  Neg Hx  (Not Specified)    ROS:  Please see the history of present illness.  All other ROS reviewed and negative.     Physical Exam/Data:   Vitals:   09/24/19 0345 09/24/19 0500 09/24/19 0800 09/24/19 0830  BP: (!) 101/51 118/83 117/84 (!) 129/90  Pulse: 92 87 103 105  Resp: 18 19 19  (!) 27  Temp: 97.8 F (36.6 C)     TempSrc: Oral     SpO2: 97% 96% 97% 94%  Weight:      Height:       No intake or output data in the 24 hours ending 09/24/19 0943  Last 3 Weights 09/24/2019 04/28/2015 04/04/2015  Weight (lbs) 230 lb 200 lb 205 lb 12.8 oz  Weight (kg) 104.327 kg 90.719 kg 93.35 kg     Body mass index is 34.97 kg/m.   General:  Well nourished, well developed, male in no acute distress HEENT: normal Lymph: no adenopathy Neck: JVD -not elevated Endocrine:  No thryomegaly Vascular: No carotid bruits; 4/4 extremity pulses 2+  Cardiac:  normal S1, S2; RRR; no murmur Lungs:  clear bilaterally, no wheezing, rhonchi or rales  Abd: soft, nontender, no hepatomegaly  Ext: no edema Musculoskeletal:  No deformities, BUE and BLE strength normal and equal Skin: warm and dry  Neuro:  CNs 2-12 intact, no focal abnormalities noted Psych:  Normal affect   EKG:  The EKG was personally reviewed and demonstrates: Initial ECG is sinus rhythm, heart rate 86, possible early repole in the inferior leads Repeat ECG is sinus rhythm, heart rate 88, T wave inversions in V1-3 Telemetry:  Telemetry was personally reviewed and demonstrates: Initially sinus tachycardia, gradual slowing to significant sinus bradycardia and a 10-second pause with resumption of sinus rhythm.   CV studies:   ECHO: Final report  pending, initial report is EF 55--60% with no wall motion abnormalities  Laboratory Data:   Chemistry Recent Labs  Lab 09/24/19 0534  NA 140  K 4.0  CL 109  CO2 23  GLUCOSE 118*  BUN 11  CREATININE 0.80  CALCIUM 9.0  GFRNONAA >60  GFRAA >60  ANIONGAP 8    Lab Results  Component Value Date   ALT 22 01/19/2015   AST 15 01/19/2015   ALKPHOS 91 01/19/2015   BILITOT 1.1 01/19/2015   Hematology Recent Labs  Lab 09/24/19 0534  WBC 12.8*  RBC 5.73  HGB 16.7  HCT 49.0  MCV 85.5  MCH 29.1  MCHC 34.1  RDW 12.9  PLT 264   Cardiac Enzymes High Sensitivity Troponin:   Recent Labs  Lab 09/24/19 0534 09/24/19 0725  TROPONINIHS 3 3      BNPNo results for input(s): BNP, PROBNP in the last 168 hours.  DDimer  Recent Labs  Lab 09/24/19 0534  DDIMER <0.27   TSH:  Lab Results  Component Value Date   TSH 2.200 09/24/2019   Lipids:No results found for: CHOL, HDL, LDLCALC, LDLDIRECT, TRIG, CHOLHDL HgbA1c:No results found for: HGBA1C Magnesium:  Magnesium  Date Value Ref Range Status  09/24/2019 2.0 1.7 - 2.4 mg/dL Final    Comment:    Performed at [facility], [address], [place], [place]   Lab Results  Component Value Date   ESRSEDRATE 1 01/19/2015      Component Value Date/Time   CRP <0.5 01/19/2015 01/21/2015     Radiology/Studies:  DG Chest 2 View  Result Date: 09/24/2019 CLINICAL DATA:  Chest pain.  History of  asthma. EXAM: CHEST - 2 VIEW COMPARISON:  10/26/2012 FINDINGS: The cardiac silhouette is accentuated by decreased lung volumes and is likely within normal limits. There is new peribronchial thickening. No confluent airspace opacity, overt pulmonary edema, pleural effusion, pneumothorax is identified. No acute osseous abnormality is seen. IMPRESSION: Peribronchial thickening which may reflect reactive airways disease or bronchitis. Electronically Signed   By: Sebastian Ache M.D.   On: 09/24/2019 04:43    Assessment and Plan:   1.  Chest  pain: -He has family history of premature coronary artery disease in his paternal grandfather -His ECG has T wave inversions that were not present on the initial ECG -Cardiac enzymes are negative x2 -However, his pain has many atypical features in that it started at rest, is worse with deep inspiration, and made worse by lying supine -However, with the arrhythmia, he likely needs an ischemic evaluation. -Sed rate and CRP are pending -Screen for cardiac risk factors with hepatic function panel, lipid panel, A1c  2.  Asystole, symptomatic bradycardia -Video taken from the telemetry in the ER was sent to EP -[facility] has been contacted for transfer to an ICU bed  3.  Chronic diarrhea -As needed medications, the patient states that he had an extensive work-up by GI but with no clear diagnosis and the medicines they tried did not help -MD advise on GI eval and as inpatient or as outpatient   Principal Problem:   Chest pain, moderate coronary artery risk Active Problems:   Symptomatic bradycardia   Sinus pause     For questions or updates, please contact [facility] Please consult [email or web address] for contact info under Cardiology/STEMI.   Melida Quitter, PA-C  09/24/2019 9:43 AM  Attending note  Patient seen and discussed with PA Barrett, I agree with her documentation. 29 yo male history of ADHD presented with pleuritic chest pain. Worst with laying down, better with leaning forward.   While in ER episode of syncope captured on telemetry. Progressive bradycardia followed by asystole 27 seconds, self resolved. Prior to episode very diaphoretic, dizzy. Repeat episode of junctional brady to the 30s transient with near syncope in ER, self resolved. EKG SR, dynamic anteiror TWIs, very mild inferior upsloping ST elevation, mild PR elevation aVR.  He reports paternal grandfather with MI in his early 26s.    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  With his family history of early CAD, combined with some dynamic anterior T waves changes cannot ischemic heart disease. With recurrent episodes will send to cath lab for temp wire and also LHC to exlucde CAD. If CAD excluded and inflammatory markers elevated would plan to treat as possible pericardits. Discussed with Dr Elberta Fortis who agrees with cath and then potential MIR.  Trops neg thus far. Starting on dobutamine to support heart rates, being transferred directly to cath lab.   Dina Rich MD

## 2019-09-24 NOTE — ED Notes (Signed)
This nurse pulled Fentanyl to give to patient this morning. Pt then brady'd down to a HR of 0. Fentanyl was held, but this nurse already drew up Fentanyl in syringe. Have manually wasted in stericycle with [facility] RN

## 2019-09-24 NOTE — ED Notes (Signed)
Report called to Aleethia with [facility]

## 2019-09-24 NOTE — ED Notes (Signed)
Pt off unit via [facility]  

## 2019-09-24 NOTE — ED Provider Notes (Signed)
Patient had another episode of asystole but did not last quite as long as the last time.  Cardiology notified and patient is being transported immediately to the Cath Lab   Bethann Berkshire, MD 09/24/19 1043

## 2019-09-24 NOTE — Progress Notes (Addendum)
[facility]  Date: 09/24/19  Time: 5:04 PM  Patient evaluated following catheterization and cardiac MRI this afternoon.  He continues to complain of severe chest pain/tightness as well as nausea.  No shortness of breath.  Right groin is sore.  Exam notable for RRR w/o m/r/g.  Anterior chest wall tenderness noted.  No RUQ tenderness or guarding.  Right groin soft without hematoma.  Mild tenderness noted.  Cardiac MRI unremarkable with LVEF 56% and no abnormal enhancement.  Trivial pericardial effusion noted but otherwise normal pericardium.  Labs notable for ESR of 3 but CRP 2.3.  D-dimer at [facility] was negative.  Etiology for continued chest pain remains uncertain.  Pain currently described as a tightness but seems to be worsened by deep inspiration.  Burtis Junes this represents an inflammatory process.  Anterior chest wall tenderness noted on exam, raising potential for costochondritis.  Colchicine was added by EP; I will also start naproxen 500 mg BID for antiinflammtory effects and analgesia.  PPI also prescribed.  No further pauses since arrival at [facility].  EP following; we will continue to monitor closely in cardiac ICU.  Repeat EKG now and in AM.  Nelva Bush, MD [facility]

## 2019-09-25 DIAGNOSIS — I300 Acute nonspecific idiopathic pericarditis: Secondary | ICD-10-CM

## 2019-09-25 DIAGNOSIS — R55 Syncope and collapse: Secondary | ICD-10-CM

## 2019-09-25 LAB — BASIC METABOLIC PANEL
Anion gap: 9 (ref 5–15)
BUN: 10 mg/dL (ref 6–20)
CO2: 26 mmol/L (ref 22–32)
Calcium: 8.6 mg/dL — ABNORMAL LOW (ref 8.9–10.3)
Chloride: 103 mmol/L (ref 98–111)
Creatinine, Ser: 0.89 mg/dL (ref 0.61–1.24)
GFR calc Af Amer: >60 mL/min (ref >60)
GFR calc non Af Amer: >60 mL/min (ref >60)
Glucose, Bld: 127 mg/dL — ABNORMAL HIGH (ref 70–99)
Potassium: 3.4 mmol/L — ABNORMAL LOW (ref 3.5–5.1)
Sodium: 138 mmol/L (ref 135–145)

## 2019-09-25 LAB — CBC
HCT: 45.2 % (ref 39.0–52.0)
Hemoglobin: 15.3 g/dL (ref 13.0–17.0)
MCH: 28.7 pg (ref 26.0–34.0)
MCHC: 33.8 g/dL (ref 30.0–36.0)
MCV: 84.8 fL (ref 80.0–100.0)
Platelets: 267 10*3/uL (ref 150–400)
RBC: 5.33 MIL/uL (ref 4.22–5.81)
RDW: 13.1 % (ref 11.5–15.5)
WBC: 11.6 10*3/uL — ABNORMAL HIGH (ref 4.0–10.5)
nRBC: 0 % (ref 0.0–0.2)

## 2019-09-25 LAB — HIGH SENSITIVITY CRP: CRP, High Sensitivity: 6.45 mg/L — ABNORMAL HIGH (ref 0.00–3.00)

## 2019-09-25 MED ORDER — FENTANYL CITRATE (PF) 100 MCG/2ML IJ SOLN
12.5000 ug | INTRAMUSCULAR | Status: DC | PRN
  Administered 2019-09-25: 12.5 ug via INTRAVENOUS
  Filled 2019-09-25: qty 2

## 2019-09-25 MED ORDER — POTASSIUM CHLORIDE CRYS ER 20 MEQ PO TBCR
20.0000 meq | EXTENDED_RELEASE_TABLET | Freq: Once | ORAL | Status: AC
  Administered 2019-09-25: 20 meq via ORAL
  Filled 2019-09-25: qty 1

## 2019-09-25 MED ORDER — FENTANYL CITRATE (PF) 100 MCG/2ML IJ SOLN: 12.5000 ug | INTRAMUSCULAR | Status: DC

## 2019-09-25 MED ORDER — SODIUM CHLORIDE 0.9 % IV SOLN: INTRAVENOUS | Status: DC

## 2019-09-25 MED ORDER — SODIUM CHLORIDE 0.9 % IV SOLN: INTRAVENOUS | Status: DC | PRN

## 2019-09-25 MED ORDER — IBUPROFEN 600 MG PO TABS
800.0000 mg | ORAL_TABLET | Freq: Three times a day (TID) | ORAL | Status: DC
  Administered 2019-09-25 – 2019-09-27 (×7): 800 mg via ORAL
  Filled 2019-09-25 (×2): qty 4
  Filled 2019-09-25: qty 1
  Filled 2019-09-25: qty 4
  Filled 2019-09-25: qty 1
  Filled 2019-09-25: qty 4
  Filled 2019-09-25: qty 1

## 2019-09-25 NOTE — Progress Notes (Addendum)
Pt felt very dizzy standing up, had 6/10 chest pain. BP taken systolic 130, MAP 96.  MD notified and Fentanyl PRN ordered for chest pain. Will continue to monitor closely.

## 2019-09-25 NOTE — Progress Notes (Signed)
Patient Name: Gregory Reeves   Patient Profile     29 y.o. male admitted with syncope and chest pain Tele.. sinus slowing and prolonged pausing DATE TEST EF   7/21 Echo   60-65 %   7/21 LHC     % Cors Normal  7/21 cMRI  56% Mild LVH LGE neg           SUBJECTIVE: History reviewed.  All of his syncopal episode followed severe "I cannot stand" pain.  Pain was pleuritic aggravated by lying flat and relieved by sitting forward  Past Medical History:  Diagnosis Date  . ADHD (attention deficit hyperactivity disorder)    as a child, does not require meds  . Allergic rhinitis 06/29/2012  . Asthma    h/o, no meds  . Diarrhea   . GERD (gastroesophageal reflux disease)   . Ileitis 01/2011   biopsies negative for IBD  . Symptomatic bradycardia 09/24/2019    Scheduled Meds:  Scheduled Meds: . Chlorhexidine Gluconate Cloth  6 each Topical Daily  . colchicine  0.6 mg Oral BID  . enoxaparin (LOVENOX) injection  40 mg Subcutaneous Q24H  . fentaNYL (SUBLIMAZE) injection  50 mcg Intravenous Once  . naproxen  500 mg Oral BID WC  . pantoprazole  40 mg Oral Daily  . sodium chloride flush  3 mL Intravenous Q12H  . sodium chloride flush  3 mL Intravenous Q12H  . sodium chloride flush  3 mL Intravenous Q12H   Continuous Infusions: . sodium chloride    . sodium chloride    . sodium chloride     sodium chloride, sodium chloride, sodium chloride, acetaminophen, ALPRAZolam, fentaNYL (SUBLIMAZE) injection, nitroGLYCERIN, ondansetron (ZOFRAN) IV, sodium chloride flush, sodium chloride flush, sodium chloride flush, zolpidem    PHYSICAL EXAM Vitals:   09/25/19 0300 09/25/19 0400 09/25/19 0500 09/25/19 0600  BP: 110/74 116/76 100/73 109/69  Pulse: 78 79 78 69  Resp: 12 12 12 13   Temp: 98 F (36.7 C)     TempSrc: Oral     SpO2: 94% 94% 95% 96%  Weight:   (!) 105.2 kg   Height:        Well developed and nourished in no acute distress HENT normal Neck supple with JVP-   Flat  Clear Regular rate and rhythm, no murmurs or gallops no rub Abd-soft with active BS No Clubbing cyanosis edema Skin-warm and dry A & Oriented  Grossly normal sensory and motor function     TELEMETRY: Reviewed personnally pt in sinus rhythm with an occasional sinus pause:  ECG personally reviewed some PR segment depression   Intake/Output Summary (Last 24 hours) at 09/25/2019 0747 Last data filed at 09/25/2019 0600 Gross per 24 hour  Intake 623.23 ml  Output 600 ml  Net 23.23 ml    LABS: Basic Metabolic Panel: Recent Labs  Lab 09/24/19 0534 09/24/19 0534 09/24/19 0728 09/24/19 1349 09/25/19 0335  NA 140  --   --  138 138  K 4.0  --   --  4.1 3.4*  CL 109  --   --  104 103  CO2 23  --   --  24 26  GLUCOSE 118*  --   --  116* 127*  BUN 11  --   --  9 10  CREATININE 0.80  --   --  0.72 0.89  CALCIUM 9.0   < >  --  9.1 8.6*  MG  --   --  2.0  --   --    < > = values in this interval not displayed.   Cardiac Enzymes: No results for input(s): CKTOTAL, CKMB, CKMBINDEX, TROPONINI in the last 72 hours. CBC: Recent Labs  Lab 09/24/19 0534 09/25/19 0335  WBC 12.8* 11.6*  NEUTROABS 9.9*  --   HGB 16.7 15.3  HCT 49.0 45.2  MCV 85.5 84.8  PLT 264 267   PROTIME: No results for input(s): LABPROT, INR in the last 72 hours. Liver Function Tests: Recent Labs    09/24/19 0849  AST 18  ALT 25  ALKPHOS 96  BILITOT 0.9  PROT 6.8  ALBUMIN 4.1   No results for input(s): LIPASE, AMYLASE in the last 72 hours. BNP: BNP (last 3 results) No results for input(s): BNP in the last 8760 hours.  ProBNP (last 3 results) No results for input(s): PROBNP in the last 8760 hours.  D-Dimer: Recent Labs    09/24/19 0534  DDIMER <0.27   Hemoglobin A1C: Recent Labs    09/24/19 0534  HGBA1C 5.2   Fasting Lipid Panel: Recent Labs    09/24/19 0849  CHOL 179  HDL 22*  LDLCALC 117*  TRIG 201*  CHOLHDL 8.1   Thyroid Function Tests: Recent Labs    09/24/19 0728    TSH 2.200   Anemia Panel: No results for input(s): VITAMINB12, FOLATE, FERRITIN, TIBC, IRON, RETICCTPCT in the last 72 hours.    ASSESSMENT AND PLAN:  Pericarditis  Syncope-vagal secondary to pain  The patient has a pain history consistent with pericarditis.  cMRI demonstrated only trivial effusion.  Without gadolinium enhancement.  Plan will be colchicine for 3 months.  Weight-based dosing would be twice daily. We will change his nonsteroidal regime to one of the more common ones, ibuprofen 800 every 8. This can be down titrated in about 2 weeks.  Ambulate.  Anticipate discharge 48 hours.   Signed, Sherryl Manges MD  09/25/2019

## 2019-09-25 NOTE — Progress Notes (Signed)
Pt ambulating to bathroom with +1 assist for cord management, some dizziness; reporting 8/10 pain in R groin site with ambulation (resolved to 0/10 pain at rest)  Pt typically has multiple loose bowel movements daily; today he reports feeling the urge, but having it resolve by the time he gets on the toilet. When attempting to bear down, he again endorses groin pain, but curiously unable to have a bowel movement. WCTM  Dr. Graciela Husbands (electrophys) at bedside having a lengthy discussion with pt and his parents. All questions answered.  Pt's wife here to visit; pt's dad asked to leave. Visitor policy explained to pt, his parents, and his wife. Wife and mom will be the pt's two visitors for the remainder of this admission; will update visitor list. Wife has questions about "jerking" movements during syncopal episodes at OSH as she was the only witness to these events. She describes pt's movements as his chin hitting his chest, then hyperextension of the neck with eyes rolling back before eventually regaining consciousness. She does endorse him "snapping out of it" after a few more moments, similar to a post ictal phase.  1200- Pt able to stool; endorses normal bowel pattern Walked 2 laps about the unit (773ft) without dizziness; only c/o tenderness at groin site as previously assessed  1600- Walked again (1034ft); denies pain in chest or groin site after walk  1700- Assisted pt and wife in setting up his mychart account

## 2019-09-26 LAB — BASIC METABOLIC PANEL
Anion gap: 7 (ref 5–15)
BUN: 14 mg/dL (ref 6–20)
CO2: 26 mmol/L (ref 22–32)
Calcium: 8.6 mg/dL — ABNORMAL LOW (ref 8.9–10.3)
Chloride: 107 mmol/L (ref 98–111)
Creatinine, Ser: 0.94 mg/dL (ref 0.61–1.24)
GFR calc Af Amer: >60 mL/min (ref >60)
GFR calc non Af Amer: >60 mL/min (ref >60)
Glucose, Bld: 108 mg/dL — ABNORMAL HIGH (ref 70–99)
Potassium: 3.8 mmol/L (ref 3.5–5.1)
Sodium: 140 mmol/L (ref 135–145)

## 2019-09-26 NOTE — Plan of Care (Signed)

## 2019-09-26 NOTE — Progress Notes (Signed)
Pt ambulating well this morning with wife as chaperone; four wheel walker used in case of dizziness, but none reported.  Pt reports bowel pattern back to normal today  Wife present at bedside for morning rounds EP provider, Graciela Husbands, MD, answered all questions, including how a dx of pericarditis was reached (cMRI interpretation available to pt via MyChart; summary shows "no evidence" of pericarditis), differentials of pericarditis vs. seizure, and return to work instructions (pt may work, but not drive, and will wear a cardiac monitor after discharge).  Plan to tx out of ICU for another night; ambulation encouraged.  1500- tx 3E24 via wheelchair with wife; pt in NAD, confirms all belongings in new room

## 2019-09-26 NOTE — Progress Notes (Signed)
Patient Name: Gregory Reeves   Patient Profile     29 y.o. male admitted with syncope and chest pain Tele.. sinus slowing and prolonged pausing DATE TEST EF   7/21 Echo   60-65 %   7/21 LHC     % Cors Normal  7/21 cMRI  56% Mild LVH LGE neg Negative for pericarditis/myocarditis           SUBJECTIVE: History reviewed.  All of his syncopal episode followed severe "I cannot stand" pain. Pain was pleuritic aggravated by lying flat and relieved by sitting forward  His wife describes some residual confusion that ended aburptly  No further pain.  Ambulating without difficulty  Past Medical History:  Diagnosis Date  . ADHD (attention deficit hyperactivity disorder)    as a child, does not require meds  . Allergic rhinitis 06/29/2012  . Asthma    h/o, no meds  . Diarrhea   . GERD (gastroesophageal reflux disease)   . Ileitis 01/2011   biopsies negative for IBD  . Symptomatic bradycardia 09/24/2019    Scheduled Meds:  Scheduled Meds: . Chlorhexidine Gluconate Cloth  6 each Topical Daily  . colchicine  0.6 mg Oral BID  . enoxaparin (LOVENOX) injection  40 mg Subcutaneous Q24H  . fentaNYL (SUBLIMAZE) injection  50 mcg Intravenous Once  . ibuprofen  800 mg Oral Q8H  . pantoprazole  40 mg Oral Daily  . sodium chloride flush  3 mL Intravenous Q12H   Continuous Infusions: . sodium chloride     sodium chloride, acetaminophen, ALPRAZolam, fentaNYL (SUBLIMAZE) injection, nitroGLYCERIN, ondansetron (ZOFRAN) IV, sodium chloride flush, zolpidem    PHYSICAL EXAM Vitals:   09/26/19 0600 09/26/19 0700 09/26/19 0725 09/26/19 0800  BP: (!) 104/56 (!) 106/58  (!) 135/77  Pulse: 55 (!) 113  78  Resp: 12 12  22   Temp:   99 F (37.2 C)   TempSrc:   Oral   SpO2: 96% 95%  96%  Weight:      Height:        Well developed and nourished in no acute distress HENT normal Neck supple with JVP-  flat   Clear Regular rate and rhythm, no murmurs or gallops Abd-soft with  active BS No Clubbing cyanosis edema Skin-warm and dry A & Oriented  Grossly normal sensory and motor function    TELEMETRY: Reviewed personally sinus rhythm with an occasional pause  ECG personally reviewed some PR segment depression   Intake/Output Summary (Last 24 hours) at 09/26/2019 0854 Last data filed at 09/25/2019 0900 Gross per 24 hour  Intake 240 ml  Output --  Net 240 ml    LABS: Basic Metabolic Panel: Recent Labs  Lab 09/24/19 0534 09/24/19 0534 09/24/19 0728 09/24/19 1349 09/24/19 1349 09/25/19 0335 09/26/19 0426  NA 140  --   --  138  --  138 140  K 4.0  --   --  4.1  --  3.4* 3.8  CL 109  --   --  104  --  103 107  CO2 23  --   --  24  --  26 26  GLUCOSE 118*  --   --  116*  --  127* 108*  BUN 11  --   --  9  --  10 14  CREATININE 0.80  --   --  0.72  --  0.89 0.94  CALCIUM 9.0   < >  --  9.1   < >  8.6* 8.6*  MG  --   --  2.0  --   --   --   --    < > = values in this interval not displayed.   Cardiac Enzymes: No results for input(s): CKTOTAL, CKMB, CKMBINDEX, TROPONINI in the last 72 hours. CBC: Recent Labs  Lab 09/24/19 0534 09/25/19 0335  WBC 12.8* 11.6*  NEUTROABS 9.9*  --   HGB 16.7 15.3  HCT 49.0 45.2  MCV 85.5 84.8  PLT 264 267   PROTIME: No results for input(s): LABPROT, INR in the last 72 hours. Liver Function Tests: Recent Labs    09/24/19 0849  AST 18  ALT 25  ALKPHOS 96  BILITOT 0.9  PROT 6.8  ALBUMIN 4.1   No results for input(s): LIPASE, AMYLASE in the last 72 hours. BNP: BNP (last 3 results) No results for input(s): BNP in the last 8760 hours.  ProBNP (last 3 results) No results for input(s): PROBNP in the last 8760 hours.  D-Dimer: Recent Labs    09/24/19 0534  DDIMER <0.27   Hemoglobin A1C: Recent Labs    09/24/19 0534  HGBA1C 5.2   Fasting Lipid Panel: Recent Labs    09/24/19 0849  CHOL 179  HDL 22*  LDLCALC 117*  TRIG 201*  CHOLHDL 8.1   Thyroid Function Tests: Recent Labs     09/24/19 0728  TSH 2.200   Anemia Panel: No results for input(s): VITAMINB12, FOLATE, FERRITIN, TIBC, IRON, RETICCTPCT in the last 72 hours.    ASSESSMENT AND PLAN:  Pericarditis  Syncope-vagal secondary to pain  Pain story is consistent with pericarditis.  The pausing episodes were associated with pain.  Thus I think, not withstanding the mild confusion and abrupt awakening afterwards, I do not think that these represent seizures  Plan will be colchicine for 3 months.  Weight-based dosing would be twice daily.  Continue ibuprofen 800 every 8. This can be down titrated in about 2 weeks.  We will plan outpatient ZIO AT monitoring  Ambulate.  Anticipate discharge 24 hours.  No driving until further notice    Signed, Sherryl Manges MD  09/26/2019

## 2019-09-27 ENCOUNTER — Encounter (HOSPITAL_COMMUNITY): Payer: Self-pay | Admitting: Internal Medicine

## 2019-09-27 ENCOUNTER — Ambulatory Visit (INDEPENDENT_AMBULATORY_CARE_PROVIDER_SITE_OTHER): Payer: BC Managed Care – PPO

## 2019-09-27 DIAGNOSIS — R001 Bradycardia, unspecified: Secondary | ICD-10-CM

## 2019-09-27 DIAGNOSIS — I469 Cardiac arrest, cause unspecified: Secondary | ICD-10-CM | POA: Diagnosis not present

## 2019-09-27 MED ORDER — IBUPROFEN 800 MG PO TABS: 800.0000 mg | ORAL_TABLET | Freq: Three times a day (TID) | ORAL | 0 refills | Status: AC

## 2019-09-27 MED ORDER — COLCHICINE 0.6 MG PO TABS: 0.6000 mg | ORAL_TABLET | Freq: Two times a day (BID) | ORAL | 0 refills | Status: DC

## 2019-09-27 MED ORDER — PANTOPRAZOLE SODIUM 40 MG PO TBEC: 40.0000 mg | DELAYED_RELEASE_TABLET | Freq: Every day | ORAL | 0 refills | Status: DC

## 2019-09-27 MED FILL — IBUPROFEN 800 MG TAB: 800 | 10 days supply | Qty: 30 | Fill #0

## 2019-09-27 MED FILL — COLCHICINE 0.6 MG TABS: 0.6 | 30 days supply | Qty: 60 | Fill #0

## 2019-09-27 MED FILL — PANTOPRAZOLE SOD DR 40 MG T: 40 | 30 days supply | Qty: 30 | Fill #0

## 2019-09-27 NOTE — Progress Notes (Signed)
Zio patch placed onto patient.  All instructions and information reviewed with patient, they verbalize understanding with no questions. 

## 2019-09-27 NOTE — TOC Progression Note (Signed)
Transition of Care [facility]) - Progression Note    Patient Details  Name: Gregory Reeves MRN: [id number] Date of Birth: Jan 15, 1991  [facility] CM/SW Contact  Leone Haven, RN Phone Number: 09/27/2019, 12:54 PM  Clinical Narrative:    Patient is for dc today, wife will get the PCP her self.  He has no other needs.        Expected Discharge Plan and Services           Expected Discharge Date: 09/27/19                                     Social Determinants of Health (SDOH) Interventions    Readmission Risk Interventions No flowsheet data found.

## 2019-09-27 NOTE — Discharge Summary (Addendum)
Discharge Summary    Patient ID: Gregory Reeves MRN: [id number]; DOB: [birth year]  Admit date: 09/24/2019 Discharge date: 09/27/2019  Primary Care Provider: Patient, No Pcp Per  Primary Cardiologist: Dina Rich, MD  Primary Electrophysiologist:  None   Discharge Diagnoses    Principal Problem:   Chest pain of uncertain etiology Active Problems:   Symptomatic bradycardia   Sinus node dysfunction (HCC)   Bradycardia   Asystole [facility])    Diagnostic Studies/Procedures    Left heart cath 09/24/19: Conclusions: No angiographically significant coronary artery disease. Normal left ventricular filling pressure. 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   Recommendations: Admit to 2-Heart ICU.  Given no further episodes of bradycardia during the procedure and plan for cardiac MRI, temporary transvenous pacemaker placement was deferred.  Dobutamine was also stopped during the procedure due to significant tachycardia. Obtain cardiac MRI. EP consultation for further recommendations regarding sinus node dysfunction.  _____________  Echo 09/24/19:    1. Left ventricular ejection fraction, by estimation, is 60 to 65%. The  left ventricle has normal function. The left ventricle has no regional  wall motion abnormalities.   2. Right ventricular systolic function is normal. The right ventricular  size is normal.   3. The inferior vena cava is normal in size with greater than 50%  respiratory variability, suggesting right atrial pressure of 3 mmHg.   4. Limited echo    History of Present Illness     Gregory Reeves is a 29 y.o. male with a hx of GERD, chronic diarrhea, ADHD as a child, family history of premature coronary artery disease in his paternal grandfather, who was seen for the evaluation of chest pain and asystole.   [name] had an episode of syncope in the fourth grade.  It  was attributed to dehydration.  He had problems with dizziness once as a young adult and was diagnosed with vertigo.  Other than that, no history of syncope and no history of chest pain.   Last p.m., he was sitting at his desk working, and had onset of substernal chest pain.  It is at the lower edge of his sternum on the left side.  It is sharp.  No significant radiation.  It hurts worse to take a deep breath.  It was initially a 2/10.  It was initially intermittent, but over the course of the evening, it became constant.  It also got worse.  By 2 AM, it was a 10/10.  He struggled with it all night.  He was not able to sleep.  It was worse lying down, a little bit better sitting up.   By 3 a.m., he decided he had to get something done.  When he went to wake his wife, he had a brief syncopal episode.  He sort of crumpled to the ground.  He does not remember getting to the ground.  She had to help him up, he could not get up unaided.  They called 911.   EMS gave him ASA 324 mg and sublingual nitroglycerin x1.  He feels the nitroglycerin helped his chest pain.  However, it did not relieve it.  He has had fentanyl in the ER with some improvement in his pain.  Currently in the ER, his chest pain is still a 5/10.  He states it would help a bit if he clenched his fist to his chest and pressed very hard.  He has chest wall soreness from that.   In the ER, he had an episode of syncope that was associated with bradycardia and 10-second pause.   He has never had anything like this before.   His paternal grandfather was diagnosed with having had a heart attack in his late 28s or early 10s.  No other family history of premature coronary artery disease and no history of any family member needing a pacemaker or other device.   He has chronic diarrhea, has had it for years.  He does not have a diagnosis associated with this.  He has not seen GI in several years.  He sometimes sees blood on the tissue and sometimes  there is blood in the toilet as well.  He chronically has 6 or 7 bowel movements a day.   Hospital Course     Consultants: EP  Chest pain Given his overall picture and family history of early heart disease, angiography was obtained. Left heart cath showed no significant CAD.  Due to tachycardia, dobutamine was stopped during heart cath. No further episodes of bradycardia, so plans for temp wire were deferred and EP consulted.  Chest pain was described as pleuritic and concerning for pericarditis. Sed rate WNL but CRP elevated to 2.3 He was started on colchicine 0.6 mg BID x 3 months, 800 mg ibuprofen x 10 days, and 40 mg protonix daily. He reports no further chest pain for the past two days. Colchicine has not aggravated his diarrhea. He does complain of right groin pain at his cath site, no hematoma appreciated.   Syncope Asystole - sinus arrest Syncope in ER was associated with bradycardia and a 10-second pause. He later had a 27-sec pause without intervention, but was associated with bradycardia in the 30s. EP was consulted. Cardiac MRI did not show pericarditis or myopercaridits. EP felt pauses were associated with a vagal response from chest pain. PPM deferred. No AV nodal blocking agents. Zio AT was placed at discharge. He was instructed not to drive for 3 months.     Did the patient have an acute coronary syndrome (MI, NSTEMI, STEMI, etc) this admission?:  No                               Did the patient have a percutaneous coronary intervention (stent / angioplasty)?:  No.   _____________  Discharge Vitals Blood pressure (!) 135/84, pulse 59, temperature 98.7 F (37.1 C), temperature source Oral, resp. rate 18, height 5\' 10"  (1.778 m), weight (!) 106.7 kg, SpO2 97 %.  Filed Weights   09/25/19 0500 09/26/19 1500 09/27/19 0320  Weight: (!) 105.2 kg (!) 106.7 kg (!) 106.7 kg    Labs & Radiologic Studies    CBC Recent Labs    09/25/19 0335  WBC 11.6*  HGB 15.3  HCT 45.2    MCV 84.8  PLT 267   Basic Metabolic Panel Recent Labs    09/27/19 0335 09/26/19 0426  NA 138 140  K 3.4* 3.8  CL 103 107  CO2 26 26  GLUCOSE 127* 108*  BUN 10 14  CREATININE 0.89 0.94  CALCIUM 8.6* 8.6*   Liver Function Tests No results for input(s): AST, ALT, ALKPHOS, BILITOT, PROT, ALBUMIN in the last 72 hours. No results for input(s): LIPASE, AMYLASE in the last 72 hours. High Sensitivity Troponin:   Recent Labs  Lab 09/24/19 0534 09/24/19 0725  TROPONINIHS 3 3  BNP Invalid input(s): POCBNP D-Dimer No results for input(s): DDIMER in the last 72 hours. Hemoglobin A1C No results for input(s): HGBA1C in the last 72 hours. Fasting Lipid Panel No results for input(s): CHOL, HDL, LDLCALC, TRIG, CHOLHDL, LDLDIRECT in the last 72 hours. Thyroid Function Tests No results for input(s): TSH, T4TOTAL, T3FREE, THYROIDAB in the last 72 hours.  Invalid input(s): FREET3 _____________  DG Chest 2 View  Result Date: 09/24/2019 CLINICAL DATA:  Chest pain.  History of asthma. EXAM: CHEST - 2 VIEW COMPARISON:  10/26/2012 FINDINGS: The cardiac silhouette is accentuated by decreased lung volumes and is likely within normal limits. There is new peribronchial thickening. No confluent airspace opacity, overt pulmonary edema, pleural effusion, pneumothorax is identified. No acute osseous abnormality is seen. IMPRESSION: Peribronchial thickening which may reflect reactive airways disease or bronchitis. Electronically Signed   By: Sebastian Ache M.D.   On: 09/24/2019 04:43   CARDIAC CATHETERIZATION  Result Date: 09/24/2019 Conclusions: 1. No angiographically significant coronary artery disease. 2. Normal left ventricular filling pressure. 3. 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 Recommendations: 1. Admit to 2-Heart ICU.  Given no further episodes of bradycardia during the procedure and plan for  cardiac MRI, temporary transvenous pacemaker placement was deferred.  Dobutamine was also stopped during the procedure due to significant tachycardia. 2. Obtain cardiac MRI. 3. EP consultation for further recommendations regarding sinus node dysfunction. Yvonne Kendall, MD [facility]   MR CARDIAC MORPHOLOGY W WO CONTRAST  Result Date: 09/24/2019 CLINICAL DATA:  29 year old male with h/o syncope and sinus arrest with 27 second pause. EXAM: CARDIAC MRI TECHNIQUE: The patient was scanned on a 1.5 Tesla GE magnet. A dedicated cardiac coil was used. Functional imaging was done using Fiesta sequences. 2,3, and 4 chamber views were done to assess for RWMA's. Modified Simpson's rule using a short axis stack was used to calculate an ejection fraction on a dedicated work [employer]. The patient received 11 cc of Gadavist. After 10 minutes inversion recovery sequences were used to assess for infiltration and scar tissue. CONTRAST:  11 cc  of Gadavist FINDINGS: 1. Normal left ventricular size with mild concentric hypertrophy and normal systolic function (LVEF = 56%). There are no regional wall motion abnormalities and no late gadolinium enhancement in the left ventricular myocardium. LVEDD: 50 mm LVESD: 36 mm LVEDV: 125 ml LVESV: 56 ml SV: 69 ml CO: 6.0 L/min Myocardial mass: 108 g 2. Normal right ventricular size, thickness and systolic function (LVEF = 52%). There are no regional wall motion abnormalities. 3.  Normal left and right atrial size. 4. Normal size of the aortic root, ascending aorta and pulmonary artery. 5. Mild aortic and trivial mitral and tricuspid regurgitation. Aortic valve is tricuspid. 6.  Normal pericardium.  Trivial pericardial effusion. IMPRESSION: 1. Normal left ventricular size with mild concentric hypertrophy and normal systolic function (LVEF = 56%). There are no regional wall motion abnormalities and no late gadolinium enhancement in the left ventricular myocardium. 2.  Normal right ventricular size, thickness and systolic function (LVEF = 52%). There are no regional wall motion abnormalities. 3.  Normal left and right atrial size. 4. Normal size of the aortic root, ascending aorta and pulmonary artery. 5. Mild aortic and trivial mitral and tricuspid regurgitation. Aortic valve is tricuspid. 6.  Normal pericardium.  Trivial pericardial effusion. There is no evidence for infiltrative or inflammatory cardiomyopathy, also no evidence for pericarditis  or myopericarditis. Electronically Signed   By: Tobias AlexanderKatarina  Nelson   On: 09/24/2019 16:52   ECHOCARDIOGRAM LIMITED  Result Date: 09/24/2019    ECHOCARDIOGRAM LIMITED REPORT   Patient Name:   Gregory Reeves Date of Exam: 09/24/2019 Medical Rec #:  [id number]        Height:       68.0 in Accession #:    [id number]       Weight:       230.0 lb Date of Birth:  01/11/1991         BSA:          2.169 m Patient Age:    29 years         BP:           117/84 mmHg Patient Gender: M                HR:           107 bpm. [facility] Procedure: Limited Echo, Cardiac Doppler and Color Doppler STAT ECHO Indications:    Pericardial effusion 423.9 / I31.3  History:        Patient has no prior history of Echocardiogram examinations.                 Signs/Symptoms:Shortness of Breath and Chest Pain. GERD.  Sonographer:    Celesta GentileBernard White RCS Referring Phys: [phone] Zadie RhineNALD WICKLINE IMPRESSIONS  1. Left ventricular ejection fraction, by estimation, is 60 to 65%. The left ventricle has normal function. The left ventricle has no regional wall motion abnormalities.  2. Right ventricular systolic function is normal. The right ventricular size is normal.  3. The inferior vena cava is normal in size with greater than 50% respiratory variability, suggesting right atrial pressure of 3 mmHg.  4. Limited echo FINDINGS  Left Ventricle: Left ventricular ejection fraction, by estimation, is 60 to 65%. The left ventricle has normal function. The left ventricle  has no regional wall motion abnormalities. The left ventricular internal cavity size was normal in size. There is  no left ventricular hypertrophy. Right Ventricle: The right ventricular size is normal. No increase in right ventricular wall thickness. Right ventricular systolic function is normal. Pericardium: There is no evidence of pericardial effusion. Aorta: The aortic root is normal in size and structure. Venous: The inferior vena cava is normal in size with greater than 50% respiratory variability, suggesting right atrial pressure of 3 mmHg. RIGHT VENTRICLE TAPSE (M-mode): 2.0 cm  AORTA Ao Root diam: 3.00 cm Dina RichJonathan Branch MD Electronically signed by Dina RichJonathan Branch MD Signature Date/Time: 09/24/2019/12:36:15 PM    Final    Disposition   Pt is being discharged home today in good condition.  Follow-up Plans & Appointments     Follow-up Information     Netta NeatQuinn, Andrew L Jr., NP Follow up on 10/18/2019.   Specialty: Cardiology Why: 9:00 AM Contact information: [address] [place] [address] [phone]                Discharge Instructions     Diet - low sodium heart healthy   Complete by: As directed    Discharge instructions   Complete by: As directed    Please do no drive until further notice.   Increase activity slowly   Complete by: As directed        Discharge Medications   Allergies as of 09/27/2019   No Known Allergies  Medication List     STOP taking these medications    Eluxadoline 100 MG Tabs Commonly known as: Viberzi   polyethylene glycol-electrolytes 420 g solution Commonly known as: TriLyte       TAKE these medications    acetaminophen 325 MG tablet Commonly known as: TYLENOL Take 325 mg by mouth every 6 (six) hours as needed for headache. What changed: Another medication with the same name was removed. Continue taking this medication, and follow the directions you see here.   colchicine 0.6 MG tablet Take 1  tablet (0.6 mg total) by mouth 2 (two) times daily.   ibuprofen 800 MG tablet Commonly known as: ADVIL Take 1 tablet (800 mg total) by mouth every 8 (eight) hours for 10 days.   pantoprazole 40 MG tablet Commonly known as: PROTONIX Take 1 tablet (40 mg total) by mouth daily. Start taking on: September 28, 2019           Outstanding Labs/Studies   Follow up as scheduled  Zio live placed at discharge  Duration of Discharge Encounter   Greater than 30 minutes including physician time.  Signed, Roe Rutherford Duke, PA 09/27/2019, 12:38 PM   Agree with note by Micah Flesher PA-C   Pt stable for DC. Ep did not want to place a PTVP. Rx for pericarditis. 2 week Zio patch. Exam benign.   Runell Gess, M.D., FACP, [facility], [facility] [place] [address] [place], [place]  [address]  [phone] 09/27/2019 12:54 PM

## 2019-09-27 NOTE — Progress Notes (Addendum)
Discharge education and medication education given to patient and spouse with teach back. Education on increasing activity slowly, low sodium heart healthy diet, when to call MD, and Zio Patch instructions given.  All questions and concerns answered. Peripheral IV and telemetry leads removed. All patient belongings given to patient. Medications delivered to patient's room from [facility] pharmacy. Patient transported to main entrance via wheelchair by nurse tech.

## 2019-09-27 NOTE — TOC Benefit Eligibility Note (Signed)
Transition of Care [facility]) Benefit Eligibility Note    Patient Details  Name: Gregory Reeves MRN: [id number] Date of Birth: 11-20-1990   Medication/Dose: COLCHICINE   TABLET  0.6 MG BID        Prescription Coverage [facility]  Spoke with Person/Company/Phone Number:: MELANIE  @ [facility] RX # [phone] OPT- MEMBER  Co-Pay: UNABLE TO GIVE CO-PAY        Additional Notes: PRESCRIPTION FOR PICK UP FOR 09-27-2019    Mardene Sayer Phone Number: 09/27/2019, 1:22 PM

## 2019-09-27 NOTE — Progress Notes (Signed)
Telemetry reviewed and case discussed with Dr. Graciela Husbands. No further bradycardia events noted on telemetry No pacemaker implant at this time. Treat pericarditis Home with Zio AT monitoring  No driving Cardiology follow up.  Francis Dowse, PA-C

## 2019-09-29 ENCOUNTER — Other Ambulatory Visit: Payer: Self-pay | Admitting: *Deleted

## 2019-09-29 DIAGNOSIS — R001 Bradycardia, unspecified: Secondary | ICD-10-CM

## 2019-09-29 DIAGNOSIS — I469 Cardiac arrest, cause unspecified: Secondary | ICD-10-CM

## 2019-10-01 ENCOUNTER — Telehealth: Payer: Self-pay | Admitting: Family Medicine

## 2019-10-01 NOTE — Telephone Encounter (Signed)
Patient called and asked for a duplicate copy of the work release letter he got upon hospital discharge. Letter was found and duplicate copy mailed to the patient. Mailing address confirmed with patient

## 2019-10-04 ENCOUNTER — Telehealth: Payer: Self-pay | Admitting: Cardiology

## 2019-10-04 DIAGNOSIS — M79604 Pain in right leg: Secondary | ICD-10-CM

## 2019-10-04 NOTE — Telephone Encounter (Signed)
Wife called back to see if patient could get a sooner appointment due to continued leg pain at at cath site especially when walking  that is unchanged from discharge. Per wife, patient gets no relief after taking ibuprofen. Reports having chest pain all the time that is unchanged. Denies SOB. Advised that no sooner appointments available but message would be sent to provider for recommendations. Also advised if patient is having severe pain, that he needed to go to the ED for an evaluation. Verbalized understanding.

## 2019-10-04 NOTE — Telephone Encounter (Signed)
New message   Patient had heart cath with Dr Wyline Mood , per wife he is having a lot of leg pain and has bruising where the cath was done, patient is not running a fever, no swelling.

## 2019-10-05 MED ORDER — PREDNISONE 20 MG PO TABS: ORAL_TABLET | ORAL | 0 refills | Status: DC

## 2019-10-05 NOTE — Telephone Encounter (Signed)
Pt voiced understanding - order placed and clarified - prednisone sent to pharmacy - pt will update [place] later this week on symptoms - sent to schedulers

## 2019-10-05 NOTE — Telephone Encounter (Signed)
Can we order a right lower extremity/groin vascular ultrasound to evaluate for leg pain after cath, can ask Angelica Chessman if need to clarify the exact order. Add prednisone 40mg  x 5 days for ongoing inflammation/pericarditis. Update on symptoms later in the week   [place] MD

## 2019-10-17 NOTE — Progress Notes (Signed)
Cardiology Office Note  Date: 10/18/2019   ID: Gregory Reeves, DOB [birth year], MRN [id number]  PCP:  Patient, No Pcp Per  Cardiologist:  Dina Rich, MD Electrophysiologist:  None   Chief Complaint: chest pain , symptomatic bradycardia, sinus node dysfunction,  Asystole,   History of Present Illness: Gregory Reeves is a 29 y.o. male with a history of GERD, chronic diarrhea, ADHD.  History of syncope in the fourth grade attributed to dehydration.  History of vertigo.  Recently seen for evaluation of chest pain and asystole.  Prior to presentation he was sitting at his desk work and had a sudden onset of substernal chest pain at the lower edge of his sternum and on the left side described as sharp without radiation.  Aggravated by taking a deep breath.  Intermittent initially but as time progressed it became constant.  When he tried to awaken his wife to go to the emergency room he had a brief syncopal episode.  EMS gave him aspirin 324 mg and 1 sublingual nitroglycerin.  He felt the sublingual nitroglycerin helped the pain but did not alleviate it.  In the emergency room he had an episode of syncope associated with bradycardia and a 10-second pause.  He later had 27-second pause without intervention but was associated with bradycardia in the 30s.  EP was consulted.  Cardiac MRI did not show pericarditis or myopericarditis.  EP felt pauses were associated with vagal response and chest pain.  PPM deferred, no AV nodal blocking agents, ZIO AT placed discharge.  Instructed not to drive for 3 months.  Patient had cardiac catheterization on 09/24/2019 which showed no angiographically significant coronary artery disease.  Patient had some right groin pain on 10/04/2019.  Dr. Wyline Mood ordered in a lower extremity groin vascular ultrasound evaluate for leg pain after cardiac catheterization.  Prednisone 40 mg x 5 days was ordered for ongoing inflammation/pericarditis.  Patient is here for hospital  follow-up status post cardiac catheterization.  States he still has some chest pain when lying down but none when exerting.  He does continue to complain of dyspnea on exertion.  He had a limited echo during hospital stay.  Which showed normal EF.  He states since taking the prednisone for pericarditis he has been unable to sleep well.  He denies any further presyncopal or syncopal episodes.  He had a recent ZIO monitor placed and recently took it off.  There are no results in epic regarding the results.  He denies any CVA or TIA-like symptoms, PND, orthopnea.  He states he does snore some and states his wife has noticed he has periods of what appear to be apnea.  He states he does snore some.  Denies any excessive daytime sleepiness.  Denies any claudication-like symptoms, DVT or PE-like symptoms, or lower extremity edema.  Blood pressure today on arrival 132/88.  States his blood pressures usually normal.   Past Medical History:  Diagnosis Date  . ADHD (attention deficit hyperactivity disorder)    as a child, does not require meds  . Allergic rhinitis 06/29/2012  . Asthma    h/o, no meds  . Diarrhea   . GERD (gastroesophageal reflux disease)   . Ileitis 01/2011   biopsies negative for IBD  . Symptomatic bradycardia 09/24/2019    Past Surgical History:  Procedure Laterality Date  . ABDOMINAL EXPLORATION SURGERY  08/02/2010   [facility] hospital  . 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 BIOPSY N/A 04/28/2015  Procedure: BIOPSY;  Surgeon: Corbin Ade, MD;  [facility];  Service: Endoscopy;  Laterality: N/A;  Terminal ileum biopsies and Rectal biopsies via cold bipsy forceps  . COLONOSCOPY  01/14/2011   Dr. Ace Gins distal TI (5 cm) cobblestoning and tiny erosions, biopsies benign without evidence of IBD  . COLONOSCOPY N/A 04/28/2015   RMR: Minimal anal canal hemorrhoids. patchy erythema of the rectum may or may not be significant. othewise normal appearing  ileo-colonoscopy aside from an innocent appearing AVM- status post terminal ileal biopsieis.   Marland Kitchen ESOPHAGOGASTRODUODENOSCOPY  01/14/2011   Dr. Elmer Ramp EGD, benign small bowel biopsy  . LEFT HEART CATH AND CORONARY ANGIOGRAPHY N/A 09/24/2019   Procedure: LEFT HEART CATH AND CORONARY ANGIOGRAPHY;  Surgeon: Yvonne Kendall, MD;  Location: [facility];  Service: Cardiovascular;  Laterality: N/A;    Current Outpatient Medications  Medication Sig Dispense Refill  . acetaminophen (TYLENOL) 325 MG tablet Take 325 mg by mouth every 6 (six) hours as needed for headache.    . colchicine 0.6 MG tablet Take 1 tablet (0.6 mg total) by mouth 2 (two) times daily. 180 tablet 0  . pantoprazole (PROTONIX) 40 MG tablet Take 1 tablet (40 mg total) by mouth daily. 90 tablet 0   No current facility-administered medications for this visit.   Allergies:  Patient has no known allergies.   Social History: The patient  reports that he has never smoked. He has never used smokeless tobacco. He reports that he does not drink alcohol and does not use drugs.   Family History: The patient's family history includes Colon polyps (age of onset: 25) in his maternal grandmother; Heart disease in his paternal grandfather; Hypertension in his father; Stomach cancer (age of onset: 48) in his maternal grandfather.   ROS:  Please see the history of present illness. Otherwise, complete review of systems is positive for none.  All other systems are reviewed and negative.   Physical Exam: VS:  BP 132/88   Pulse 77   Ht 5\' 8"  (1.727 m)   Wt 232 lb 3.2 oz (105.3 kg)   SpO2 94%   BMI 35.31 kg/m , BMI Body mass index is 35.31 kg/m.  Wt Readings from Last 3 Encounters:  10/18/19 232 lb 3.2 oz (105.3 kg)  09/27/19 (!) 235 lb 3.2 oz (106.7 kg)  04/28/15 200 lb (90.7 kg)    General: Patient appears comfortable at rest. Neck: Supple, no elevated JVP or carotid bruits, no thyromegaly. Lungs: Clear to auscultation,  nonlabored breathing at rest. Cardiac: Regular rate and rhythm, no S3 or significant systolic murmur, no pericardial rub. Extremities: No pitting edema, distal pulses 2+. Skin: Warm and dry. Musculoskeletal: No kyphosis. Neuropsychiatric: Alert and oriented x3, affect grossly appropriate.  ECG:  September 25, 2019: EKG showed sinus rhythm with sinus arrhythmia rate of 79, inferior lateral ups sloping ST elevation with mild PR depression.  Suspect acute pericarditis.  Recent Labwork: 09/24/2019: ALT 25; AST 18; Magnesium 2.0; TSH 2.200 09/25/2019: Hemoglobin 15.3; Platelets 267 09/26/2019: BUN 14; Creatinine, Ser 0.94; Potassium 3.8; Sodium 140     Component Value Date/Time   CHOL 179 09/24/2019 0849   TRIG 201 (H) 09/24/2019 0849   HDL 22 (L) 09/24/2019 0849   CHOLHDL 8.1 09/24/2019 0849   VLDL 40 09/24/2019 0849   LDLCALC 117 (H) 09/24/2019 0849    Other Studies Reviewed Today:   Left heart cath 09/24/19: Conclusions: 1. No angiographically significant coronary artery disease. 2. Normal left ventricular  filling pressure. 3. 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  Recommendations: 1. Admit to 2-Heart ICU. Given no further episodes of bradycardia during the procedure and plan for cardiac MRI, temporary transvenous pacemaker placement was deferred. Dobutamine was also stopped during the procedure due to significant tachycardia. 2. Obtain cardiac MRI. 3. EP consultation for further recommendations regarding sinus node dysfunction.  _____________  Echo 09/24/19:   1. Left ventricular ejection fraction, by estimation, is 60 to 65%. The  left ventricle has normal function. The left ventricle has no regional  wall motion abnormalities.  2. Right ventricular systolic function is normal. The right ventricular  size is normal.  3. The inferior vena cava is normal in size with greater than  50%  respiratory variability, suggesting right atrial pressure of 3 mmHg.  4. Limited echo   Assessment and Plan:  1. Chest pain of uncertain etiology   2. DOE (dyspnea on exertion)   3. Suspected sleep apnea   4. Palpitations    1. Chest pain of uncertain etiology Recent history of chest pain with subsequent cardiac catheterization which showed normal coronary arteries.  He continues to complain of chest pain mostly when lying flat in bed.  Denies radiation to neck, arm, back or jaw.  Denies any nausea, vomiting, or diaphoresis.  2. DOE (dyspnea on exertion) Continues to complain of dyspnea on exertion when walking up steps carrying his children, or other exertional activities.  Please get a complete echocardiogram.   3. Suspected sleep apnea He admits to some snoring, periods of witnessed apnea per his wife, he denies any excessive daytime sleepiness.  Please refer for sleep study.  4. Palpitations/asystole? Recently had a ZIO monitor placed.  He was having pauses between 9 and 27 seconds on telemetry at recent hospital stay.  We do not have those results.  Please attempt to obtain those results.  Medication Adjustments/Labs and Tests Ordered: Current medicines are reviewed at length with the patient today.  Concerns regarding medicines are outlined above.   Disposition: Follow-up with Dr. Wyline Mood or APP 3 months  Signed, Rennis Harding, NP 10/18/2019 10:01 AM    [facility] at [facility] [address] [place], [place], [place] [address] Phone: [phone]; Fax: [phone]

## 2019-10-18 ENCOUNTER — Ambulatory Visit (INDEPENDENT_AMBULATORY_CARE_PROVIDER_SITE_OTHER): Payer: BC Managed Care – PPO | Admitting: Family Medicine

## 2019-10-18 ENCOUNTER — Encounter: Payer: Self-pay | Admitting: Family Medicine

## 2019-10-18 VITALS — BP 132/88 | HR 77 | Ht 68.0 in | Wt 232.2 lb

## 2019-10-18 DIAGNOSIS — R29818 Other symptoms and signs involving the nervous system: Secondary | ICD-10-CM

## 2019-10-18 DIAGNOSIS — R002 Palpitations: Secondary | ICD-10-CM

## 2019-10-18 DIAGNOSIS — R0609 Other forms of dyspnea: Secondary | ICD-10-CM

## 2019-10-18 DIAGNOSIS — R0600 Dyspnea, unspecified: Secondary | ICD-10-CM

## 2019-10-18 DIAGNOSIS — R079 Chest pain, unspecified: Secondary | ICD-10-CM

## 2019-10-18 NOTE — Patient Instructions (Signed)
Medication Instructions:  Continue all current medications.  Labwork: none  Testing/Procedures:  Your physician has requested that you have an echocardiogram. Echocardiography is a painless test that uses sound waves to create images of your heart. It provides your doctor with information about the size and shape of your heart and how well your heart's chambers and valves are working. This procedure takes approximately one hour. There are no restrictions for this procedure.  Office will contact with results via phone or letter.    Follow-Up: 3 months   Any Other Special Instructions Will Be Listed Below (If Applicable). You have been referred to:  Sleep referral - [facility]  If you need a refill on your cardiac medications before your next appointment, please call your pharmacy.

## 2019-10-20 ENCOUNTER — Ambulatory Visit (INDEPENDENT_AMBULATORY_CARE_PROVIDER_SITE_OTHER): Payer: BC Managed Care – PPO

## 2019-10-20 DIAGNOSIS — R0600 Dyspnea, unspecified: Secondary | ICD-10-CM

## 2019-10-20 DIAGNOSIS — R0609 Other forms of dyspnea: Secondary | ICD-10-CM

## 2019-10-20 LAB — ECHOCARDIOGRAM COMPLETE
Area-P 1/2: 2.39 cm2
Calc EF: 73.7 %
MV M vel: 1.07 m/s
MV Peak grad: 4.6 mmHg
S' Lateral: 3.41 cm
Single Plane A2C EF: 80.8 %
Single Plane A4C EF: 63.1 %

## 2019-10-22 ENCOUNTER — Telehealth: Payer: Self-pay | Admitting: *Deleted

## 2019-10-22 NOTE — Telephone Encounter (Signed)
Patient informed. 

## 2019-10-22 NOTE — Telephone Encounter (Signed)
-----   Message from Netta Neat., NP sent at 10/22/2019  8:50 AM EDT ----- Please call the patient and tell him the echocardiogram shows good pumping function of his heart.  Tell him there is no structural abnormality that would cause his shortness of breath.  He has no leaky valves.  Basically a normal echocardiogram.  Thanks

## 2019-11-24 ENCOUNTER — Institutional Professional Consult (permissible substitution): Payer: PRIVATE HEALTH INSURANCE | Admitting: Neurology

## 2019-11-24 ENCOUNTER — Telehealth: Payer: Self-pay | Admitting: *Deleted

## 2019-11-24 NOTE — Telephone Encounter (Signed)
No showed new patient appointment. 

## 2020-01-11 DIAGNOSIS — R059 Cough, unspecified: Secondary | ICD-10-CM | POA: Diagnosis not present

## 2020-01-11 DIAGNOSIS — Z20822 Contact with and (suspected) exposure to covid-19: Secondary | ICD-10-CM | POA: Diagnosis not present

## 2020-02-01 ENCOUNTER — Ambulatory Visit (INDEPENDENT_AMBULATORY_CARE_PROVIDER_SITE_OTHER): Payer: BC Managed Care – PPO | Admitting: Cardiology

## 2020-02-01 ENCOUNTER — Telehealth: Payer: Self-pay | Admitting: *Deleted

## 2020-02-01 ENCOUNTER — Encounter: Payer: Self-pay | Admitting: Cardiology

## 2020-02-01 VITALS — BP 130/80 | HR 78 | Ht 68.0 in | Wt 229.4 lb

## 2020-02-01 DIAGNOSIS — R0602 Shortness of breath: Secondary | ICD-10-CM | POA: Diagnosis not present

## 2020-02-01 DIAGNOSIS — Z8679 Personal history of other diseases of the circulatory system: Secondary | ICD-10-CM | POA: Diagnosis not present

## 2020-02-01 NOTE — Patient Instructions (Signed)
Your physician wants you to follow-up in: 6 MONTHS WITH DR BRANCH You will receive a reminder letter in the mail two months in advance. If you don't receive a letter, please call our office to schedule the follow-up appointment.  Your physician recommends that you continue on your current medications as directed. Please refer to the Current Medication list given to you today.  Your physician has recommended that you have a pulmonary function test. Pulmonary Function Tests are a group of tests that measure how well air moves in and out of your lungs.  Thank you for choosing [facility]!!    

## 2020-02-01 NOTE — Progress Notes (Signed)
Clinical Summary Gregory Reeves is a 29 y.o.male seen today for follow up of the following medical problems.   1. Chest pain/Pericarditis - admission 09/2019 with chest pain consistent with pericarditis. Also with transient severe conduction disease including prolonged episodes of sinus arrest and asystole that self resolved. Follow up outpatient monitor without significant arrhythmia - cath with normal coronaries - echo LVEF 60-65%, no WMAs  - he stopped his colchicine. No recent chest pain.  - some recent SOB, mild cough. No recent edema     Past Medical History:  Diagnosis Date  . ADHD (attention deficit hyperactivity disorder)    as a child, does not require meds  . Allergic rhinitis 06/29/2012  . Asthma    h/o, no meds  . Diarrhea   . GERD (gastroesophageal reflux disease)   . Ileitis 01/2011   biopsies negative for IBD  . Symptomatic bradycardia 09/24/2019     No Known Allergies   Current Outpatient Medications  Medication Sig Dispense Refill  . acetaminophen (TYLENOL) 325 MG tablet Take 325 mg by mouth every 6 (six) hours as needed for headache.    . colchicine 0.6 MG tablet Take 1 tablet (0.6 mg total) by mouth 2 (two) times daily. 180 tablet 0  . pantoprazole (PROTONIX) 40 MG tablet Take 1 tablet (40 mg total) by mouth daily. 90 tablet 0   No current facility-administered medications for this visit.     Past Surgical History:  Procedure Laterality Date  . ABDOMINAL EXPLORATION SURGERY  08/02/2010   [facility] hospital  . 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 BIOPSY N/A 04/28/2015   Procedure: BIOPSY;  Surgeon: Corbin Ade, MD;  [facility];  Service: Endoscopy;  Laterality: N/A;  Terminal ileum biopsies and Rectal biopsies via cold bipsy forceps  . COLONOSCOPY  01/14/2011   Dr. Ace Gins distal TI (5 cm) cobblestoning and tiny erosions, biopsies benign without evidence of IBD  . COLONOSCOPY N/A 04/28/2015   RMR: Minimal  anal canal hemorrhoids. patchy erythema of the rectum may or may not be significant. 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   Marland Kitchen ESOPHAGOGASTRODUODENOSCOPY  01/14/2011   Dr. Elmer Ramp EGD, benign small bowel biopsy  . LEFT HEART CATH AND CORONARY ANGIOGRAPHY N/A 09/24/2019   Procedure: LEFT HEART CATH AND CORONARY ANGIOGRAPHY;  Surgeon: Yvonne Kendall, MD;  Location: [facility];  Service: Cardiovascular;  Laterality: N/A;     No Known Allergies    Family History  Problem Relation Age of Onset  . Hypertension Father   . Colon polyps Maternal Grandmother 40       partial colectomy secondary to numerous polyps  . Stomach cancer Maternal Grandfather 68       deceased  . Heart disease Paternal Grandfather        late 65s, early 2s  . Crohn's disease Neg Hx   . Liver disease Neg Hx      Social History Mr. Kahler reports that he has never smoked. He has never used smokeless tobacco. Mr. Savage reports no history of alcohol use.   Review of Systems CONSTITUTIONAL: No weight loss, fever, chills, weakness or fatigue.  HEENT: Eyes: No visual loss, blurred vision, double vision or yellow sclerae.No hearing loss, sneezing, congestion, runny nose or sore throat.  SKIN: No rash or itching.  CARDIOVASCULAR: per hpi RESPIRATORY: per hpi GASTROINTESTINAL: No anorexia, nausea, vomiting or diarrhea. No abdominal  pain or blood.  GENITOURINARY: No burning on urination, no polyuria NEUROLOGICAL: No headache, dizziness, syncope, paralysis, ataxia, numbness or tingling in the extremities. No change in bowel or bladder control.  MUSCULOSKELETAL: No muscle, back pain, joint pain or stiffness.  LYMPHATICS: No enlarged nodes. No history of splenectomy.  PSYCHIATRIC: No history of depression or anxiety.  ENDOCRINOLOGIC: No reports of sweating, cold or heat intolerance. No polyuria or polydipsia.   Marland Kitchen   Physical Examination Today's Vitals   02/01/20 1001  BP: 130/80  Pulse: 78  SpO2: 98%  Weight: 229 lb 6.4 oz (104.1 kg)  Height: 5\' 8"  (1.727 m)   Body mass index is 34.88 kg/m.  Gen: resting comfortably, no acute distress HEENT: no scleral icterus, pupils equal round and reactive, no palptable cervical adenopathy,  CV: RRR, no mr/g, no jvd Resp: Clear to auscultation bilaterally GI: abdomen is soft, non-tender, non-distended, normal bowel sounds, no hepatosplenomegaly MSK: extremities are warm, no edema.  Skin: warm, no rash Neuro:  no focal deficits Psych: appropriate affect   Diagnostic Studies 09/2019 cath Conclusions: 1. No angiographically significant coronary artery disease. 2. Normal left ventricular filling pressure. 3. 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  Recommendations: 1. Admit to 2-Heart ICU.  Given no further episodes of bradycardia during the procedure and plan for cardiac MRI, temporary transvenous pacemaker placement was deferred.  Dobutamine was also stopped during the procedure due to significant tachycardia. 2. Obtain cardiac MRI. 3. EP consultation for further recommendations regarding sinus node dysfunction.  09/2019 echo 1. Left ventricular ejection fraction, by estimation, is 60 to 65%. The  left ventricle has normal function. The left ventricle has no regional  wall motion abnormalities.  2. Right ventricular systolic function is normal. The right ventricular  size is normal.  3. The inferior vena cava is normal in size with greater than 50%  respiratory variability, suggesting right atrial pressure of 3 mmHg.  4. Limited echo    09/2019 [facility] IMPRESSION: 1. Normal left ventricular size with mild concentric hypertrophy and normal systolic function (LVEF = 56%). There are no regional wall motion abnormalities and no late gadolinium  enhancement in the left ventricular myocardium.  2. Normal right ventricular size, thickness and systolic function (LVEF = 52%). There are no regional wall motion abnormalities.  3.  Normal left and right atrial size.  4. Normal size of the aortic root, ascending aorta and pulmonary artery.  5. Mild aortic and trivial mitral and tricuspid regurgitation. Aortic valve is tricuspid.  6.  Normal pericardium.  Trivial pericardial effusion.  There is no evidence for infiltrative or inflammatory cardiomyopathy, also no evidence for pericarditis or myopericarditis.   10/2019 echo IMPRESSIONS    1. Left ventricular ejection fraction, by estimation, is 60 to 65%. The  left ventricle has normal function. The left ventricle has no regional  wall motion abnormalities. Left ventricular diastolic parameters were  normal.  2. Right ventricular systolic function is normal. The right ventricular  size is not well visualized. There is normal pulmonary artery systolic  pressure.  3. The mitral valve is normal in structure. No evidence of mitral valve  regurgitation. No evidence of mitral stenosis.  4. The aortic valve was not well visualized. Aortic valve regurgitation  is not visualized. No aortic stenosis is present.  5. The inferior vena cava is normal in size with greater than 50%  respiratory variability, suggesting right atrial pressure  of 3 mmHg.    Assessment and Plan  1. History of pericarditis - no recurrent symptoms, has completed course of colchcine and now off - he has asked about getting his 3rd moderna covid vaccine. He reports his 2nd shot April 7, he was admitted 09/24/19. From the data events usually happen within first week of shot, no cases reported 3.5 months out from vaccination, I would not see a connection and would be ok with him getting his 3rd shot.      2. DOE - repeat echo was normal - he does have a history of prior asthma, obtain PFTs.  - normal  cath during admission. At this time no signs symptoms are cardiac        Antoine Poche, M.D.

## 2020-02-01 NOTE — Telephone Encounter (Signed)
Pt had Moderna covid vaccine 05/12/2019 and 2nd dose 06/09/2019 per Dr Wyline Mood regarding booster he was admitted 3.5 months after his second covid shot with pericarditis. Typically these events occur within 1 week if related to vaccine, no reported cases from the data I reviewed that far out, I dont think it was related and would be ok to get his 3rd covid shot  Pt voiced understanding and appreciative

## 2020-02-16 ENCOUNTER — Other Ambulatory Visit (HOSPITAL_COMMUNITY)
Admission: RE | Admit: 2020-02-16 | Discharge: 2020-02-16 | Disposition: A | Discharge status: Home / Self Care | Payer: BC Managed Care – PPO | Source: Ambulatory Visit | Attending: Cardiology | Admitting: Cardiology

## 2020-02-16 NOTE — Progress Notes (Signed)
Saw patient in the hall and he told me he was here for an ultrasound. Didn't realize til 10 or 15 minutes later he was supposed to come see me too. Called patient voicemail box was full.  I'll try him again tomorrow to see if he can come in and get his covid test before his PFT on 02/18/2019. Leaving encounter open.

## 2020-02-17 ENCOUNTER — Other Ambulatory Visit (HOSPITAL_COMMUNITY)
Admission: RE | Admit: 2020-02-17 | Discharge: 2020-02-17 | Disposition: A | Discharge status: Home / Self Care | Payer: BC Managed Care – PPO | Source: Ambulatory Visit | Attending: Cardiology | Admitting: Cardiology

## 2020-02-17 ENCOUNTER — Other Ambulatory Visit (HOSPITAL_COMMUNITY): Payer: BC Managed Care – PPO

## 2020-02-17 ENCOUNTER — Other Ambulatory Visit: Payer: Self-pay

## 2020-02-17 DIAGNOSIS — Z01812 Encounter for preprocedural laboratory examination: Secondary | ICD-10-CM | POA: Diagnosis not present

## 2020-02-17 DIAGNOSIS — Z20822 Contact with and (suspected) exposure to covid-19: Secondary | ICD-10-CM | POA: Insufficient documentation

## 2020-02-18 ENCOUNTER — Ambulatory Visit (HOSPITAL_COMMUNITY)
Admission: RE | Admit: 2020-02-18 | Discharge: 2020-02-18 | Disposition: A | Discharge status: Home / Self Care | Payer: BC Managed Care – PPO | Source: Ambulatory Visit | Attending: Cardiology | Admitting: Cardiology

## 2020-02-18 DIAGNOSIS — R0602 Shortness of breath: Secondary | ICD-10-CM | POA: Insufficient documentation

## 2020-02-18 LAB — PULMONARY FUNCTION TEST
DL/VA % pred: 115 %
DL/VA: 5.81 ml/min/mmHg/L
DLCO unc % pred: 102 %
DLCO unc: 31.7 ml/min/mmHg
FEF 25-75 Post: 4.66 L/sec
FEF 25-75 Pre: 3.77 L/sec
FEF2575-%Change-Post: 23 %
FEF2575-%Pred-Post: 105 %
FEF2575-%Pred-Pre: 84 %
FEV1-%Change-Post: 5 %
FEV1-%Pred-Post: 89 %
FEV1-%Pred-Pre: 84 %
FEV1-Post: 3.85 L
FEV1-Pre: 3.64 L
FEV1FVC-%Change-Post: 3 %
FEV1FVC-%Pred-Pre: 99 %
FEV6-%Change-Post: 2 %
FEV6-%Pred-Post: 87 %
FEV6-%Pred-Pre: 85 %
FEV6-Post: 4.51 L
FEV6-Pre: 4.42 L
FEV6FVC-%Change-Post: 0 %
FEV6FVC-%Pred-Post: 100 %
FEV6FVC-%Pred-Pre: 100 %
FVC-%Change-Post: 1 %
FVC-%Pred-Post: 86 %
FVC-%Pred-Pre: 85 %
FVC-Post: 4.51 L
FVC-Pre: 4.43 L
Post FEV1/FVC ratio: 85 %
Post FEV6/FVC ratio: 100 %
Pre FEV1/FVC ratio: 82 %
Pre FEV6/FVC Ratio: 100 %
RV % pred: 110 %
RV: 1.61 L
TLC % pred: 92 %
TLC: 5.99 L

## 2020-02-18 LAB — SARS CORONAVIRUS 2 (TAT 6-24 HRS): SARS Coronavirus 2: NEGATIVE

## 2020-02-18 MED ORDER — ALBUTEROL SULFATE (2.5 MG/3ML) 0.083% IN NEBU
2.5000 mg | INHALATION_SOLUTION | Freq: Once | RESPIRATORY_TRACT | Status: AC
  Administered 2020-02-18: 10:00:00 2.5 mg via RESPIRATORY_TRACT

## 2020-02-23 ENCOUNTER — Telehealth: Payer: Self-pay | Admitting: *Deleted

## 2020-02-23 NOTE — Telephone Encounter (Signed)
Pt voiced understanding - says symptoms SOB remain the same

## 2020-02-23 NOTE — Telephone Encounter (Signed)
-----   Message from Antoine Poche, MD sent at 02/21/2020 11:03 AM EST ----- Breathing tests look ok. Do not see strong reason to repeat any heart testing given extensive recent testing. No clear etiology for his SOB as of now. How are his symptoms doing?  Dominga Ferry MD

## 2020-04-26 DIAGNOSIS — J029 Acute pharyngitis, unspecified: Secondary | ICD-10-CM | POA: Diagnosis not present

## 2020-04-26 DIAGNOSIS — B349 Viral infection, unspecified: Secondary | ICD-10-CM | POA: Diagnosis not present

## 2020-08-14 ENCOUNTER — Ambulatory Visit (INDEPENDENT_AMBULATORY_CARE_PROVIDER_SITE_OTHER): Payer: BC Managed Care – PPO | Admitting: Cardiology

## 2020-08-14 ENCOUNTER — Telehealth: Payer: Self-pay | Admitting: Cardiology

## 2020-08-14 ENCOUNTER — Encounter: Payer: Self-pay | Admitting: Cardiology

## 2020-08-14 VITALS — BP 130/80 | HR 96 | Ht 68.0 in | Wt 235.0 lb

## 2020-08-14 DIAGNOSIS — R002 Palpitations: Secondary | ICD-10-CM | POA: Diagnosis not present

## 2020-08-14 DIAGNOSIS — R55 Syncope and collapse: Secondary | ICD-10-CM | POA: Diagnosis not present

## 2020-08-14 NOTE — Addendum Note (Signed)
Addended by: Lesle Chris on: 08/14/2020 02:58 PM   Modules accepted: Orders

## 2020-08-14 NOTE — Telephone Encounter (Signed)
PERCERT:  30 day preventice monitor - syncope

## 2020-08-14 NOTE — Patient Instructions (Signed)
Medication Instructions:  Continue all current medications.  Labwork: none  Testing/Procedures: Your physician has recommended that you wear a 30 day event monitor. Event monitors are medical devices that record the heart's electrical activity. Doctors most often us these monitors to diagnose arrhythmias. Arrhythmias are problems with the speed or rhythm of the heartbeat. The monitor is a small, portable device. You can wear one while you do your normal daily activities. This is usually used to diagnose what is causing palpitations/syncope (passing out).  Office will contact with results via phone or letter.    Follow-Up: 6 weeks    Any Other Special Instructions Will Be Listed Below (If Applicable).   If you need a refill on your cardiac medications before your next appointment, please call your pharmacy.  

## 2020-08-14 NOTE — Progress Notes (Signed)
Clinical Summary Gregory Reeves is a 30 y.o.male seen today for follow up of the following medical problems.    1. Chest pain/Pericarditis - admission 09/2019 with chest pain consistent with pericarditis. Also with transient severe conduction disease including prolonged episodes of sinus arrest and asystole that self resolved. Follow up outpatient monitor without significant arrhythmia - cath with normal coronaries - echo LVEF 60-65%, no WMAs   - he stopped his colchicine. No recent chest pain.  - some recent SOB, mild cough. No recent edema   02/2020 PFTs normal   - no recent symptoms.    2. Syncope/Near syncope - symptoms starated about 1-2 month ago  First episode had just finished after sex. Felt heart racing, stood up and walked to bathroom. Vision change, heart racing, fell down to floor. Very brief that he was down, wifre reported he was down about 30 seconds  - second episode walking in neihborhood. Vision change/blurry vision, palpitations. Fell down on to road. Unsure if true LOC.   - 6 total episodes, last one was end of May - last episode walking up stairs at work - can feel dizzy laying flat.  - drinks 1 liter water bottle, sweet tea or kool aid.    SH: works at [employer], [employer].   Past Medical History:  Diagnosis Date   ADHD (attention deficit hyperactivity disorder)    as a child, does not require meds   Allergic rhinitis 06/29/2012   Asthma    h/o, no meds   Diarrhea    GERD (gastroesophageal reflux disease)    Ileitis 01/2011   biopsies negative for IBD   Symptomatic bradycardia 09/24/2019     No Known Allergies   Current Outpatient Medications  Medication Sig Dispense Refill   acetaminophen (TYLENOL) 325 MG tablet Take 325 mg by mouth every 6 (six) hours as needed for headache.     colchicine 0.6 MG tablet Take 1 tablet (0.6 mg total) by mouth 2 (two) times daily. (Patient not taking: Reported on 02/01/2020) 180 tablet 0    pantoprazole (PROTONIX) 40 MG tablet Take 1 tablet (40 mg total) by mouth daily. (Patient not taking: Reported on 02/01/2020) 90 tablet 0   No current facility-administered medications for this visit.     Past Surgical History:  Procedure Laterality Date   ABDOMINAL EXPLORATION SURGERY  08/02/2010   [facility] hospital   APPENDECTOMY  08-02-2010   path-->serrated adenoma   BIOPSY N/A 04/28/2015   Procedure: BIOPSY;  Surgeon: Corbin Ade, MD;  Location: [facility];  Service: Endoscopy;  Laterality: N/A;  Terminal ileum biopsies and Rectal biopsies via cold bipsy forceps   COLONOSCOPY  01/14/2011   Dr. Ace Gins distal TI (5 cm) cobblestoning and tiny erosions, biopsies benign without evidence of IBD   COLONOSCOPY N/A 04/28/2015   RMR: Minimal anal canal hemorrhoids. patchy erythema of the rectum may or may not be significant. 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    ESOPHAGOGASTRODUODENOSCOPY  01/14/2011   Dr. Elmer Ramp EGD, benign small bowel biopsy   LEFT HEART CATH AND CORONARY ANGIOGRAPHY N/A 09/24/2019   Procedure: LEFT HEART CATH AND CORONARY ANGIOGRAPHY;  Surgeon: Yvonne Kendall, MD;  Location: [facility] INV[facility];  Service: Cardiovascular;  Laterality: N/A;     No Known Allergies    Family History  Problem Relation Age of Onset   Hypertension Father    Colon polyps Maternal Grandmother  40       partial colectomy secondary to numerous polyps   Stomach cancer Maternal Grandfather 07-01-2057       deceased   Heart disease Paternal Grandfather        late 18s, early 63s   Crohn's disease Neg Hx    Liver disease Neg Hx      Social History Gregory Reeves reports that he has never smoked. He has never used smokeless tobacco. Gregory Reeves reports no history of alcohol use.   Review of Systems CONSTITUTIONAL: No weight loss, fever, chills, weakness or fatigue.  HEENT: Eyes: No visual  loss, blurred vision, double vision or yellow sclerae.No hearing loss, sneezing, congestion, runny nose or sore throat.  SKIN: No rash or itching.  CARDIOVASCULAR: per hpi RESPIRATORY: No shortness of breath, cough or sputum.  GASTROINTESTINAL: No anorexia, nausea, vomiting or diarrhea. No abdominal pain or blood.  GENITOURINARY: No burning on urination, no polyuria NEUROLOGICAL: per hpi MUSCULOSKELETAL: No muscle, back pain, joint pain or stiffness.  LYMPHATICS: No enlarged nodes. No history of splenectomy.  PSYCHIATRIC: No history of depression or anxiety.  ENDOCRINOLOGIC: No reports of sweating, cold or heat intolerance. No polyuria or polydipsia.  Marland Kitchen   Physical Examination Today's Vitals   08/14/20 1346  BP: 130/80  Pulse: 96  SpO2: 98%  Weight: 235 lb (106.6 kg)  Height: 5\' 8"  (1.727 m)   Body mass index is 35.73 kg/m.  Gen: resting comfortably, no acute distress HEENT: no scleral icterus, pupils equal round and reactive, no palptable cervical adenopathy,  CV: RRR, no m/r/g, no jvd Resp: Clear to auscultation bilaterally GI: abdomen is soft, non-tender, non-distended, normal bowel sounds, no hepatosplenomegaly MSK: extremities are warm, no edema.  Skin: warm, no rash Neuro:  no focal deficits Psych: appropriate affect   Diagnostic Studies  09/2019 cath Conclusions: No angiographically significant coronary artery disease. Normal left ventricular filling pressure. 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   Recommendations: Admit to 2-Heart ICU.  Given no further episodes of bradycardia during the procedure and plan for cardiac MRI, temporary transvenous pacemaker placement was deferred.  Dobutamine was also stopped during the procedure due to significant tachycardia. Obtain cardiac MRI. EP consultation for further recommendations regarding sinus node dysfunction.   09/2019  echo 1. Left ventricular ejection fraction, by estimation, is 60 to 65%. The  left ventricle has normal function. The left ventricle has no regional  wall motion abnormalities.   2. Right ventricular systolic function is normal. The right ventricular  size is normal.   3. The inferior vena cava is normal in size with greater than 50%  respiratory variability, suggesting right atrial pressure of 3 mmHg.   4. Limited echo      09/2019 CMRI IMPRESSION: 1. Normal left ventricular size with mild concentric hypertrophy and normal systolic function (LVEF = 56%). There are no regional wall motion abnormalities and no late gadolinium enhancement in the left ventricular myocardium.   2. Normal right ventricular size, thickness and systolic function (LVEF = 52%). There are no regional wall motion abnormalities.   3.  Normal left and right atrial size.   4. Normal size of the aortic root, ascending aorta and pulmonary artery.   5. Mild aortic and trivial mitral and tricuspid regurgitation. Aortic valve is tricuspid.   6.  Normal pericardium.  Trivial pericardial effusion.   There is no evidence for infiltrative or inflammatory cardiomyopathy,  also no evidence for pericarditis or myopericarditis.     10/2019 echo IMPRESSIONS     1. Left ventricular ejection fraction, by estimation, is 60 to 65%. The  left ventricle has normal function. The left ventricle has no regional  wall motion abnormalities. Left ventricular diastolic parameters were  normal.   2. Right ventricular systolic function is normal. The right ventricular  size is not well visualized. There is normal pulmonary artery systolic  pressure.   3. The mitral valve is normal in structure. No evidence of mitral valve  regurgitation. No evidence of mitral stenosis.   4. The aortic valve was not well visualized. Aortic valve regurgitation  is not visualized. No aortic stenosis is present.   5. The inferior vena cava is  normal in size with greater than 50%  respiratory variability, suggesting right atrial pressure of 3 mmHg.   Assessment and Plan  1. History of pericarditis - no recurrent symptoms   2. Syncope - unclear etiology -orthostatics are normal here. EKG today shows NSR - plan for 30 day monitor   Antoine Poche, M.D

## 2020-08-18 ENCOUNTER — Ambulatory Visit (INDEPENDENT_AMBULATORY_CARE_PROVIDER_SITE_OTHER): Payer: BC Managed Care – PPO

## 2020-08-18 DIAGNOSIS — R55 Syncope and collapse: Secondary | ICD-10-CM

## 2020-09-21 ENCOUNTER — Other Ambulatory Visit: Payer: Self-pay | Admitting: *Deleted

## 2020-09-21 DIAGNOSIS — R55 Syncope and collapse: Secondary | ICD-10-CM

## 2020-10-01 NOTE — Progress Notes (Deleted)
Cardiology Office Note  Date: 10/01/2020   ID: Gregory Reeves, DOB April 10, 1990, MRN [id number]  PCP:  Babs Sciara, MD  Cardiologist:  Dina Rich, MD Electrophysiologist:  None   Chief Complaint: 6 week follow up.  History of Present Illness: Gregory Reeves is a 30 y.o. male with a history of Chest pain / pericarditis, Syncope / near syncope. Symptomatic bradycardia.  He was last seem by Dr Wyline Mood on 08/14/2020. He had previous admission July 2021 with chest pain consistent with pericarditis. Had transient severe conduction system disease with prolonged episodes of sinus arrest and asystole which resolved. He had a follow up monitor without significant arrhythmias. He had stopped his colchicine without chest pain. He had some recent SOB. He had syncopal episodes beginning approximately 1-2 months prior. He had a total of 6 episodes over the period. Plans were to order 30 day cardiac monitor.  Cardiac monitor syncope and collapse  Past Medical History:  Diagnosis Date   ADHD (attention deficit hyperactivity disorder)    as a child, does not require meds   Allergic rhinitis 06/29/2012   Asthma    h/o, no meds   Diarrhea    GERD (gastroesophageal reflux disease)    Ileitis 01/2011   biopsies negative for IBD   Symptomatic bradycardia 09/24/2019    Past Surgical History:  Procedure Laterality Date   ABDOMINAL EXPLORATION SURGERY  08/02/2010   [facility] hospital   APPENDECTOMY  08-02-2010   path-->serrated adenoma   BIOPSY N/A 04/28/2015   Procedure: BIOPSY;  Surgeon: Corbin Ade, MD;  [facility];  Service: Endoscopy;  Laterality: N/A;  Terminal ileum biopsies and Rectal biopsies via cold bipsy forceps   COLONOSCOPY  01/14/2011   Dr. Ace Gins distal TI (5 cm) cobblestoning and tiny erosions, biopsies benign without evidence of IBD   COLONOSCOPY N/A 04/28/2015   RMR: Minimal anal canal hemorrhoids. patchy erythema of the rectum may or may not be  significant. 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    ESOPHAGOGASTRODUODENOSCOPY  01/14/2011   Dr. Elmer Ramp EGD, benign small bowel biopsy   LEFT HEART CATH AND CORONARY ANGIOGRAPHY N/A 09/24/2019   Procedure: LEFT HEART CATH AND CORONARY ANGIOGRAPHY;  Surgeon: Yvonne Kendall, MD;  Location: [facility];  Service: Cardiovascular;  Laterality: N/A;    No current outpatient medications on file.   No current facility-administered medications for this visit.   Allergies:  Patient has no known allergies.   Social History: The patient  reports that he has never smoked. He has never used smokeless tobacco. He reports that he does not drink alcohol and does not use drugs.   Family History: The patient's family history includes Colon polyps (age of onset: 36) in his maternal grandmother; Heart disease in his paternal grandfather; Hypertension in his father; Stomach cancer (age of onset: 28) in his maternal grandfather.   ROS:  Please see the history of present illness. Otherwise, complete review of systems is positive for {NONE DEFAULTED:18576}.  All other systems are reviewed and negative.   Physical Exam: VS:  There were no vitals taken for this visit., BMI There is no height or weight on file to calculate BMI.  Wt Readings from Last 3 Encounters:  08/14/20 235 lb (106.6 kg)  02/01/20 229 lb 6.4 oz (104.1 kg)  10/18/19 232 lb 3.2 oz (105.3 kg)    General: Patient appears comfortable at rest. HEENT: Conjunctiva  and lids normal, oropharynx clear with moist mucosa. Neck: Supple, no elevated JVP or carotid bruits, no thyromegaly. Lungs: Clear to auscultation, nonlabored breathing at rest. Cardiac: Regular rate and rhythm, no S3 or significant systolic murmur, no pericardial rub. Abdomen: Soft, nontender, no hepatomegaly, bowel sounds present, no guarding or rebound. Extremities: No pitting edema, distal  pulses 2+. Skin: Warm and dry. Musculoskeletal: No kyphosis. Neuropsychiatric: Alert and oriented x3, affect grossly appropriate.  ECG:  {EKG/Telemetry Strips Reviewed:[id number]}  Recent Labwork: No results found for requested labs within last 8760 hours.     Component Value Date/Time   CHOL 179 09/24/2019 0849   TRIG 201 (H) 09/24/2019 0849   HDL 22 (L) 09/24/2019 0849   CHOLHDL 8.1 09/24/2019 0849   VLDL 40 09/24/2019 0849   LDLCALC 117 (H) 09/24/2019 0849    Other Studies Reviewed Today:  Cardiac monitor 09/21/2020 Summary: The patient's monitoring period was 08/18/2020 - 09/16/2020. Baseline sample showed Sinus Rhythm with a heart rate of 84 bpm. There were 0 critical, 0 serious, and 4 stable events that occurred. The report analysis of the critical, serious, stable and manually triggered events are listed below. Manually Detected Events: 1 Stable: Sinus Rhythm  Baseline 1 Stable: Sinus Rhythm w/Artifact  Tired or Fatigued Automatically Detected Events: 1 Stable: Sinus Bradycardia, Sinus Rhythm w/Artifact 1 Stable: Sinus Rhythm, Sinus Bradycardia w/Artifact End     09/2019 cath Conclusions: No angiographically significant coronary artery disease. Normal left ventricular filling pressure. 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   Recommendations: Admit to 2-Heart ICU.  Given no further episodes of bradycardia during the procedure and plan for cardiac MRI, temporary transvenous pacemaker placement was deferred.  Dobutamine was also stopped during the procedure due to significant tachycardia. Obtain cardiac MRI. EP consultation for further recommendations regarding sinus node dysfunction.   09/2019 echo 1. Left ventricular ejection fraction, by estimation, is 60 to 65%. The  left ventricle has normal function. The left ventricle has no regional  wall motion abnormalities.    2. Right ventricular systolic function is normal. The right ventricular  size is normal.   3. The inferior vena cava is normal in size with greater than 50%  respiratory variability, suggesting right atrial pressure of 3 mmHg.   4. Limited echo      09/2019 CMRI IMPRESSION: 1. Normal left ventricular size with mild concentric hypertrophy and normal systolic function (LVEF = 56%). There are no regional wall motion abnormalities and no late gadolinium enhancement in the left ventricular myocardium.   2. Normal right ventricular size, thickness and systolic function (LVEF = 52%). There are no regional wall motion abnormalities.   3.  Normal left and right atrial size.   4. Normal size of the aortic root, ascending aorta and pulmonary artery.   5. Mild aortic and trivial mitral and tricuspid regurgitation. Aortic valve is tricuspid.   6.  Normal pericardium.  Trivial pericardial effusion.   There is no evidence for infiltrative or inflammatory cardiomyopathy, also no evidence for pericarditis or myopericarditis.     10/2019 echo IMPRESSIONS     1. Left ventricular ejection fraction, by estimation, is 60 to 65%. The  left ventricle has normal function. The left ventricle has no regional  wall motion abnormalities. Left ventricular diastolic parameters were  normal.   2. Right ventricular systolic function is normal. The right ventricular  size is not well visualized. There is normal  pulmonary artery systolic  pressure.   3. The mitral valve is normal in structure. No evidence of mitral valve  regurgitation. No evidence of mitral stenosis.   4. The aortic valve was not well visualized. Aortic valve regurgitation  is not visualized. No aortic stenosis is present.   5. The inferior vena cava is normal in size with greater than 50%  respiratory variability, suggesting right atrial pressure of 3 mmHg.   Assessment and Plan:  1. History of pericarditis   2. Syncope and  collapse      Medication Adjustments/Labs and Tests Ordered: Current medicines are reviewed at length with the patient today.  Concerns regarding medicines are outlined above.   Disposition: Follow-up with ***  Signed, Rennis Harding, NP 10/01/2020 11:11 PM    [facility] [address] [place], [place], [place] [address] Phone: [phone]; Fax: [phone]

## 2020-10-02 ENCOUNTER — Ambulatory Visit: Payer: BC Managed Care – PPO | Admitting: Family Medicine

## 2020-10-02 DIAGNOSIS — Z8679 Personal history of other diseases of the circulatory system: Secondary | ICD-10-CM

## 2020-10-02 DIAGNOSIS — R55 Syncope and collapse: Secondary | ICD-10-CM

## 2020-10-03 ENCOUNTER — Encounter: Payer: Self-pay | Admitting: Family Medicine

## 2021-04-21 ENCOUNTER — Emergency Department (HOSPITAL_COMMUNITY): Payer: BC Managed Care – PPO

## 2021-04-21 ENCOUNTER — Emergency Department (HOSPITAL_COMMUNITY)
Admission: EM | Admit: 2021-04-21 | Discharge: 2021-04-21 | Disposition: A | Discharge status: Home / Self Care | Payer: BC Managed Care – PPO | Attending: Emergency Medicine | Admitting: Emergency Medicine

## 2021-04-21 ENCOUNTER — Encounter (HOSPITAL_COMMUNITY): Payer: Self-pay

## 2021-04-21 ENCOUNTER — Other Ambulatory Visit: Payer: Self-pay

## 2021-04-21 DIAGNOSIS — R55 Syncope and collapse: Secondary | ICD-10-CM | POA: Diagnosis not present

## 2021-04-21 DIAGNOSIS — J45909 Unspecified asthma, uncomplicated: Secondary | ICD-10-CM | POA: Insufficient documentation

## 2021-04-21 LAB — URINALYSIS, ROUTINE W REFLEX MICROSCOPIC
Bilirubin Urine: NEGATIVE
Glucose, UA: NEGATIVE mg/dL
Hgb urine dipstick: NEGATIVE
Ketones, ur: NEGATIVE mg/dL
Leukocytes,Ua: NEGATIVE
Nitrite: NEGATIVE
Protein, ur: NEGATIVE mg/dL
Specific Gravity, Urine: 1.014 (ref 1.005–1.030)
pH: 5 (ref 5.0–8.0)

## 2021-04-21 LAB — CBC
HCT: 50.3 % (ref 39.0–52.0)
Hemoglobin: 17.4 g/dL — ABNORMAL HIGH (ref 13.0–17.0)
MCH: 29.2 pg (ref 26.0–34.0)
MCHC: 34.6 g/dL (ref 30.0–36.0)
MCV: 84.5 fL (ref 80.0–100.0)
Platelets: 225 10*3/uL (ref 150–400)
RBC: 5.95 MIL/uL — ABNORMAL HIGH (ref 4.22–5.81)
RDW: 12.8 % (ref 11.5–15.5)
WBC: 13.5 10*3/uL — ABNORMAL HIGH (ref 4.0–10.5)
nRBC: 0 % (ref 0.0–0.2)

## 2021-04-21 LAB — COMPREHENSIVE METABOLIC PANEL
ALT: 27 U/L (ref 0–44)
AST: 20 U/L (ref 15–41)
Albumin: 4 g/dL (ref 3.5–5.0)
Alkaline Phosphatase: 90 U/L (ref 38–126)
Anion gap: 8 (ref 5–15)
BUN: 14 mg/dL (ref 6–20)
CO2: 24 mmol/L (ref 22–32)
Calcium: 9 mg/dL (ref 8.9–10.3)
Chloride: 106 mmol/L (ref 98–111)
Creatinine, Ser: 1.09 mg/dL (ref 0.61–1.24)
GFR, Estimated: >60 mL/min (ref >60)
Glucose, Bld: 122 mg/dL — ABNORMAL HIGH (ref 70–99)
Potassium: 4.5 mmol/L (ref 3.5–5.1)
Sodium: 138 mmol/L (ref 135–145)
Total Bilirubin: 1.3 mg/dL — ABNORMAL HIGH (ref 0.3–1.2)
Total Protein: 6.8 g/dL (ref 6.5–8.1)

## 2021-04-21 LAB — LIPASE, BLOOD: Lipase: 37 U/L (ref 11–51)

## 2021-04-21 MED ORDER — ACETAMINOPHEN 500 MG PO TABS
1000.0000 mg | ORAL_TABLET | ORAL | Status: AC
  Administered 2021-04-21: 1000 mg via ORAL
  Filled 2021-04-21: qty 2

## 2021-04-21 MED ORDER — SODIUM CHLORIDE 0.9 % IV BOLUS
1000.0000 mL | Freq: Once | INTRAVENOUS | Status: AC
  Administered 2021-04-21: 1000 mL via INTRAVENOUS

## 2021-04-21 MED ORDER — DIPHENHYDRAMINE HCL 50 MG/ML IJ SOLN
25.0000 mg | Freq: Once | INTRAMUSCULAR | Status: AC
  Administered 2021-04-21: 25 mg via INTRAVENOUS
  Filled 2021-04-21: qty 1

## 2021-04-21 MED ORDER — METOCLOPRAMIDE HCL 5 MG/ML IJ SOLN
10.0000 mg | Freq: Once | INTRAMUSCULAR | Status: AC
  Administered 2021-04-21: 10 mg via INTRAVENOUS
  Filled 2021-04-21: qty 2

## 2021-04-21 NOTE — ED Provider Notes (Signed)
[facility] EMERGENCY DEPARTMENT Provider Note   CSN: [id number] Arrival date & time: 04/21/21  1226     History  Chief Complaint  Patient presents with   Loss of Consciousness   Diarrhea    Gregory Reeves is a 31 y.o. male with history of symptomatic bradycardia, asthma, diarrhea, GERD, ADHD.  Patient presents to ED for evaluation of syncopal event that occurred today.  Patient was upstairs in the hospital visiting a family member who was in the ICU when he had sudden onset feeling of lightheadedness and dizziness.  Patient states that this time that he sat down in the chair and then proceeded to pass out for 1 minute.  The patient's wife witnessed the event, she is at the bedside to provide details.  She states that the patient lost consciousness for 1 minute and had jerking like movement consistent with a seizure.  The patient denies any history of seizures in the past.  Patient's wife says that the patient was then confused for 2 to 3 minutes after he regained consciousness.  A nurse that was at the bedside when this occurred stated that she thought that the patient was "seizing".  Patient has been seen for the same complaint most notably back in 09/27/2019 when he was admitted for evaluation.  Patient states he has not seen his cardiologist in 6 months.  Patient endorsing weakness, lightheadedness, diarrhea, nausea, headache.  Patient denies fevers, chest pain, shortness of breath, abdominal pain.   Loss of Consciousness Associated symptoms: headaches, nausea and weakness   Associated symptoms: no chest pain, no fever, no shortness of breath and no vomiting   Diarrhea Associated symptoms: headaches   Associated symptoms: no chills, no fever and no vomiting       Home Medications Prior to Admission medications   Not on File      Allergies    Patient has no known allergies.    Review of Systems   Review of Systems  Constitutional:  Negative for chills and  fever.  Respiratory:  Negative for shortness of breath.   Cardiovascular:  Positive for syncope. Negative for chest pain.  Gastrointestinal:  Positive for diarrhea and nausea. Negative for vomiting.  Neurological:  Positive for weakness, light-headedness and headaches.  All other systems reviewed and are negative.  Physical Exam Updated Vital Signs BP 122/82    Pulse 88    Temp 98.6 F (37 C)    Resp 20    Ht 5\' 8"  (1.727 m)    Wt 106.6 kg    SpO2 96%    BMI 35.73 kg/m  Physical Exam Vitals and nursing note reviewed.  Constitutional:      General: He is not in acute distress.    Appearance: He is not ill-appearing, toxic-appearing or diaphoretic.  HENT:     Head: Normocephalic and atraumatic.     Nose: Nose normal.     Mouth/Throat:     Mouth: Mucous membranes are moist.  Eyes:     Extraocular Movements: Extraocular movements intact.     Conjunctiva/sclera: Conjunctivae normal.     Pupils: Pupils are equal, round, and reactive to light.  Cardiovascular:     Rate and Rhythm: Normal rate and regular rhythm.  Pulmonary:     Effort: Pulmonary effort is normal.     Breath sounds: Normal breath sounds. No wheezing.  Abdominal:     General: Abdomen is flat.     Palpations: Abdomen is soft.  Tenderness: There is no abdominal tenderness.  Musculoskeletal:     Cervical back: Normal range of motion and neck supple. No tenderness.  Skin:    General: Skin is warm and dry.     Capillary Refill: Capillary refill takes less than 2 seconds.  Neurological:     Mental Status: He is alert and oriented to person, place, and time.     GCS: GCS eye subscore is 4. GCS verbal subscore is 5. GCS motor subscore is 6.     Cranial Nerves: Cranial nerves 2-12 are intact. No cranial nerve deficit.     Sensory: No sensory deficit.     Motor: Motor function is intact. No weakness.     Coordination: Coordination is intact.    ED Results / Procedures / Treatments   Labs (all labs ordered are  listed, but only abnormal results are displayed) Labs Reviewed  COMPREHENSIVE METABOLIC PANEL - Abnormal; Notable for the following components:      Result Value   Glucose, Bld 122 (*)    Total Bilirubin 1.3 (*)    All other components within normal limits  CBC - Abnormal; Notable for the following components:   WBC 13.5 (*)    RBC 5.95 (*)    Hemoglobin 17.4 (*)    All other components within normal limits  LIPASE, BLOOD  URINALYSIS, ROUTINE W REFLEX MICROSCOPIC    EKG EKG Interpretation  Date/Time:  Saturday April 21 2021 12:41:36 EST Ventricular Rate:  90 PR Interval:  155 QRS Duration: 96 QT Interval:  344 QTC Calculation: 421 R Axis:   89 Text Interpretation: Sinus rhythm Borderline low voltage, extremity leads Confirmed by Kristine Royal [phone]) on 04/21/2021 1:22:00 PM  Radiology CT Head Wo Contrast  Result Date: 04/21/2021 CLINICAL DATA:  Loss of consciousness. EXAM: CT HEAD WITHOUT CONTRAST TECHNIQUE: Contiguous axial images were obtained from the base of the skull through the vertex without intravenous contrast. RADIATION DOSE REDUCTION: This exam was performed according to the departmental dose-optimization program which includes automated exposure control, adjustment of the mA and/or kV according to patient size and/or use of iterative reconstruction technique. COMPARISON:  MR head 06/23/2003 FINDINGS: Brain: No evidence of acute infarction, hemorrhage, hydrocephalus, extra-axial collection or mass lesion/mass effect. Vascular: No hyperdense vessel or unexpected calcification. Skull: Normal. Negative for fracture or focal lesion. Sinuses/Orbits: No acute finding. Mild-to-moderate mucosal thickening in the partially visualized right maxillary sinus as well as a few bilateral ethmoid air cells the other paranasal sinuses and mastoid air cells are clear. Other: None. IMPRESSION: No acute intracranial abnormality. Electronically Signed   By: Sherron Ales M.D.   On: 04/21/2021  15:03    Procedures Procedures    Medications Ordered in ED Medications  sodium chloride 0.9 % bolus 1,000 mL (0 mLs Intravenous Stopped 04/21/21 1504)  acetaminophen (TYLENOL) tablet 1,000 mg (1,000 mg Oral Given 04/21/21 1508)    ED Course/ Medical Decision Making/ A&P                           Medical Decision Making Amount and/or Complexity of Data Reviewed Labs: ordered. Radiology: ordered.  Risk OTC drugs.   31 year old male presents to ED for evaluation of syncope and collapse witnessed by nurse as well as his wife.  Patient's wife states that the nurse described the patient as having "seizure-like activity" following the syncope.  Patient has no history of seizure. On examination, the patient is afebrile, nontachycardic, not  hypoxic, clear lung sounds bilaterally, soft compressible abdomen.  Patient worked up utilizing following labs and imaging studies interpreted by me: UA, CMP, CBC, lipase, CT head Lipase unremarkable CBC shows elevated white blood cell count of 13.5.  On chart review, the patient has a history of elevated white blood cell counts CMP unremarkable UA unremarkable CT head unremarkable, no signs of intracranial mass, herniation, bleed  At this time, the cause of the patient's syncope and collapse is unknown.  I consulted with cardiology for their expertise and guidance.  Awaiting callback from the patient's cardiologist, Dr. Wyline Mood.  At the end of my shift, cardiology had not yet returned my call. Patient signed out to Rhea Bleacher PA-C for further disposition and management. Patient stable at time of admission.    Final Clinical Impression(s) / ED Diagnoses Final diagnoses:  Syncope and collapse    Rx / DC Orders ED Discharge Orders     None         Clent Ridges 04/21/21 1641    Wynetta Fines, MD 04/22/21 [phone]

## 2021-04-21 NOTE — Progress Notes (Signed)
I have sent a message to our office's EP scheduling team requesting a follow-up appointment in 6 weeks with Dr. Ladona Ridgel in [place] per Dr. Lubertha Basque request, and our office will call the patient with this information.

## 2021-04-21 NOTE — ED Notes (Signed)
Provider at bedside, delay in labs

## 2021-04-21 NOTE — ED Provider Notes (Signed)
Signout from [employer] at shift change. Briefly, patient presents for syncopal episode versus seizure-like activity.  Patient has a history of admission to the hospital and cardiac work-up for sinus node dysfunction.  Patient with 1 to 2 minutes of shaking today with 1 to 2 minutes of subsequent confusion.  He had a positive prodrome.   Plan: I spoke with Dr. Wyline Mood of cardiology.  Dr. Ladona Ridgel with EP has seen the patient in the emergency department.  They will arrange for follow-up as outpatient.  No indication for admission from a cardiac standpoint today.  Recommendations appreciated.   6:05 PM Reassessment performed after given Benadryl and Reglan for headache. Patient appears comfortable.  States that his headache is improving.  Patient and family at bedside are comfortable discharged home at this time.  Discussed syncope/seizure precautions.  Labs and imaging personally reviewed and interpreted including: Head CT which was negative, CBC with elevated white blood cell count and hemoglobin likely stress response versus dehydration, normal CMP and lipase.    Most current vital signs reviewed and are as follows: BP 121/80    Pulse 86    Temp 98.6 F (37 C)    Resp 15    Ht 5\' 8"  (1.727 m)    Wt 106.6 kg    SpO2 96%    BMI 35.73 kg/m     Plan: Discharged home with cardiology and neurology referrals   Home treatment: Encouraged rest and hydration   Return and follow-up instructions: Encouraged return to ED with chest pain, shortness of breath, additional episode of lightheadedness, syncope, seizure-like activity. Encouraged patient to follow-up with referrals as provided. Patient verbalized understanding and agreed with plan.        , PA-C 04/21/21 1807    04/23/21, MD 05/02/21 1325

## 2021-04-21 NOTE — Consult Note (Signed)
Cardiology Consultation:   Patient ID: Gregory Reeves MRN: [id number]; DOB: [birth year]  Admit date: 04/21/2021 Date of Consult: 04/21/2021  PCP:  Kathyrn Drown, MD   [facility] Providers Cardiologist:  Carlyle Dolly, MD   Patient Profile:   Gregory Reeves is a 31 y.o. male with a hx of syncope who is being seen 04/21/2021 for the evaluation of syncope at the request of Dr. Francia Greaves.  History of Present Illness:   Gregory Reeves is a very pleasant 31 year old man with a history of recurrent episodes of syncope since he was a child.  The patient has never injured his self.  When he woke up this morning he felt poorly and did not eat or drink much.  He describes nausea, vomiting, and diarrhea.  He eventually felt better and came to [facility] with his wife.  The patient's father-in-law was in the hospital after having had heart surgery.  He began to feel poorly and his wife noticed that he looked pale.  He sat down and then became unresponsive having seizure-like activity.  Eventually he awoke and felt better.  He was taken to the emergency room.  He was given intravenous fluids.  He now feels much better.  Laboratory exam is unremarkable.  Twelve-lead EKG is normal. Cardiac markers are normal.  The patient states that he has had many episodes like this in the past.  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  He has had documented pauses with his vagal spells of over 20 seconds.  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 Bernestine Amass distal TI (5 cm) cobblestoning and tiny erosions, biopsies benign without evidence of IBD   COLONOSCOPY N/A 04/28/2015   RMR: Minimal anal canal hemorrhoids. patchy erythema of the rectum may or may not be significant. 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    ESOPHAGOGASTRODUODENOSCOPY  01/14/2011   Dr. Vivi Ferns EGD, benign small bowel biopsy   LEFT HEART CATH AND CORONARY ANGIOGRAPHY N/A 09/24/2019   Procedure: LEFT HEART CATH AND CORONARY ANGIOGRAPHY;  Surgeon: Nelva Bush, MD;  Location: [place] [facility];  Service: Cardiovascular;  Laterality: N/A;     Home Medications:  Prior to Admission medications   Not on File    Inpatient Medications: Scheduled Meds:  diphenhydrAMINE  25 mg Intravenous Once   metoCLOPramide (REGLAN) injection  10 mg Intravenous Once   Continuous Infusions:  PRN Meds:   Allergies:   No Known Allergies  Social History:   Social History   Socioeconomic History   Marital status: Married    Spouse name: Not on file   Number of children: 0  Years of education: Not on file   Highest education level: Not on file  Occupational History   Occupation: [employer]    Comment:    Tobacco Use   Smoking status: Never   Smokeless tobacco: Never  Substance and Sexual Activity   Alcohol use: No    Alcohol/week: 0.0 standard drinks   Drug use: No   Sexual activity: Yes  Other Topics Concern   Not on file  Social History Narrative   Lives with both parents   Social Determinants of Health   Financial Resource Strain: Not on file  Food Insecurity: Not on file  Transportation Needs: Not on  file  Physical Activity: Not on file  Stress: Not on file  Social Connections: Not on file  Intimate Partner Violence: Not on file    Family History:   No prior history of syncope or premature coronary disease. Family History  Problem Relation Age of Onset   Hypertension Father    Colon polyps Maternal Grandmother 1       partial colectomy secondary to numerous polyps   Stomach cancer Maternal Grandfather 50       deceased   Heart disease Paternal Grandfather        late 38s, early 75s   Crohn's disease Neg Hx    Liver disease Neg Hx      ROS:  Please see the history of present illness.   All other ROS reviewed and negative.     Physical Exam/Data:   Vitals:   04/21/21 1530 04/21/21 1600 04/21/21 1630 04/21/21 1700  BP: 126/83 123/81 122/82 131/80  Pulse: 90 91 88 96  Resp: 16 18 20 14   Temp:      SpO2: 97% 98% 96% 96%  Weight:      Height:       No intake or output data in the 24 hours ending 04/21/21 1717 Last 3 Weights 04/21/2021 08/14/2020 02/01/2020  Weight (lbs) 235 lb 235 lb 229 lb 6.4 oz  Weight (kg) 106.595 kg 106.595 kg 104.055 kg     Body mass index is 35.73 kg/m.  General:  Well nourished, well developed, in no acute distress HEENT: normal Neck: no JVD Vascular: No carotid bruits; Distal pulses 2+ bilaterally Cardiac:  normal S1, S2; RRR; no murmur  Lungs:  clear to auscultation bilaterally, no wheezing, rhonchi or rales  Abd: soft, nontender, no hepatomegaly  Ext: no edema Musculoskeletal:  No deformities, BUE and BLE strength normal and equal Skin: warm and dry  Neuro:  CNs 2-12 intact, no focal abnormalities noted Psych:  Normal affect   EKG:  The EKG was personally reviewed and demonstrates: Normal sinus rhythm Telemetry:  Telemetry was personally reviewed and demonstrates: Normal sinus rhythm  Orthostatic vitals are pending  Relevant CV Studies: Prior cardiac MRI normal  Laboratory Data:  High Sensitivity Troponin:  No results for  input(s): TROPONINIHS in the last 720 hours.   Chemistry Recent Labs  Lab 04/21/21 1354  NA 138  K 4.5  CL 106  CO2 24  GLUCOSE 122*  BUN 14  CREATININE 1.09  CALCIUM 9.0  GFRNONAA >60  ANIONGAP 8    Recent Labs  Lab 04/21/21 1354  PROT 6.8  ALBUMIN 4.0  AST 20  ALT 27  ALKPHOS 90  BILITOT 1.3*   Lipids No results for input(s): CHOL, TRIG, HDL, LABVLDL, LDLCALC, CHOLHDL in the last 168 hours.  Hematology Recent Labs  Lab 04/21/21 1354  WBC 13.5*  RBC  5.95*  HGB 17.4*  HCT 50.3  MCV 84.5  MCH 29.2  MCHC 34.6  RDW 12.8  PLT 225   Thyroid No results for input(s): TSH, FREET4 in the last 168 hours.  BNPNo results for input(s): BNP, PROBNP in the last 168 hours.  DDimer No results for input(s): DDIMER in the last 168 hours.   Radiology/Studies:  CT Head Wo Contrast  Result Date: 04/21/2021 CLINICAL DATA:  Loss of consciousness. EXAM: CT HEAD WITHOUT CONTRAST TECHNIQUE: Contiguous axial images were obtained from the base of the skull through the vertex without intravenous contrast. RADIATION DOSE REDUCTION: This exam was performed according to the departmental dose-optimization program which includes automated exposure control, adjustment of the mA and/or kV according to patient size and/or use of iterative reconstruction technique. COMPARISON:  MR head 06/23/2003 FINDINGS: Brain: No evidence of acute infarction, hemorrhage, hydrocephalus, extra-axial collection or mass lesion/mass effect. Vascular: No hyperdense vessel or unexpected calcification. Skull: Normal. Negative for fracture or focal lesion. Sinuses/Orbits: No acute finding. Mild-to-moderate mucosal thickening in the partially visualized right maxillary sinus as well as a few bilateral ethmoid air cells the other paranasal sinuses and mastoid air cells are clear. Other: None. IMPRESSION: No acute intracranial abnormality. Electronically Signed   By: Ileana Roup M.D.   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  I discussed the mechanism, pathophysiology, and treatment for the patient.  Of asked him to refrain from alcohol or tobacco or caffeine.  I have asked him to avoid missing meals and to stay well-hydrated and liberalize his salt intake.  I have asked him to lie down when he feels a spill occurring.  If his spells become more frequent we would consider adding Florinef or midodrine.  He can be discharged home.  Hospitalization is not indicated at this time.  I will see him back in the office in 6 weeks. 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  Hopefully it will remain quiet.  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   Risk Assessment/Risk Scores:        [facility] will sign off.   Medication Recommendations: Increase sodium in the diet and avoid diuretics Other recommendations (labs, testing, etc): None Follow up as an outpatient: 6 weeks with me in [place]  For questions or updates, please contact [place] Please consult [email or web address] for contact info under    Signed, Cristopher Peru, MD  04/21/2021 5:17 PM

## 2021-04-21 NOTE — ED Triage Notes (Signed)
Pt was visiting family upstairs in ICU. Pt had loss of consciousness while up there. Pt states that he has hx of pericarditis, and had a similar incident last year. Pt states that he has been having diarrhea every twenty minutes today as well.

## 2021-04-21 NOTE — Discharge Instructions (Addendum)
Please read and follow all provided instructions.  Your diagnoses today include:  1. Syncope and collapse     Tests performed today include: Complete blood cell count: Elevated white blood cell count, elevated red blood cell count suggesting dehydration Basic metabolic panel: No problems CT of your brain: Does not show any problems Vital signs. See below for your results today.   Medications prescribed:  None  Take any prescribed medications only as directed.  Home care instructions:  Follow any educational materials contained in this packet.  As we are not certain if you had a seizure, it would be prudent for you not to drive until you can follow-up with a neurologist.  Standard seizure precautions: Per [facility] statutes, patients with seizures are not allowed to drive until they have been seizure-free for six months. Use caution when using heavy equipment or power tools. Avoid working on ladders or at heights. Take showers instead of baths. Ensure the water temperature is not too high on the home water heater. Do not go swimming alone. When caring for infants or small children, sit down when holding, feeding, or changing them to minimize risk of injury to the child in the event you have a seizure. To reduce risk of seizures, maintain good sleep hygiene avoid alcohol and illicit drug use, take all anti-seizure medications as prescribed.    Follow-up instructions: Please follow-up with Dr. Lubertha Basque office as planned as well as the neurology referral.  Return instructions:  Please return to the Emergency Department if you experience worsening symptoms.  Return if you develop chest pain, shortness of breath, have additional episodes of passing out or seizure-like activity. Please return if you have any other emergent concerns.  Additional Information:  Your vital signs today were: BP 129/84    Pulse 78    Temp 98.6 F (37 C)    Resp 18    Ht 5\' 8"  (1.727 m)    Wt 106.6 kg     SpO2 98%    BMI 35.73 kg/m  If your blood pressure (BP) was elevated above 135/85 this visit, please have this repeated by your doctor within one month. --------------

## 2021-04-26 ENCOUNTER — Ambulatory Visit (INDEPENDENT_AMBULATORY_CARE_PROVIDER_SITE_OTHER): Payer: BC Managed Care – PPO | Admitting: Neurology

## 2021-04-26 ENCOUNTER — Encounter: Payer: Self-pay | Admitting: Neurology

## 2021-04-26 VITALS — BP 141/99 | HR 79 | Ht 68.0 in | Wt 235.0 lb

## 2021-04-26 DIAGNOSIS — R569 Unspecified convulsions: Secondary | ICD-10-CM

## 2021-04-26 DIAGNOSIS — R55 Syncope and collapse: Secondary | ICD-10-CM

## 2021-04-26 NOTE — Progress Notes (Signed)
[facility]  PATIENT: Gregory Reeves DOB: January 23, 1991  REQUESTING CLINICIAN: Carlisle Cater, PA-C HISTORY FROM: Patient and mother  REASON FOR VISIT: Syncope vs. Seizure    HISTORICAL  CHIEF COMPLAINT:  Chief Complaint  Patient presents with   New Patient (Initial Visit)    Room 13 - alone. ED follow up for seizure-like activity.     HISTORY OF PRESENT ILLNESS:  This is a 31 year old gentleman with past medical history of asthma and pericarditis in 2021 who is presenting after seizure versus syncope.  Patient reports visiting his father-in-law in ICU on February 18, he reported on the specific day he was not feeling well, had diarrhea all morning and was throwing up in the afternoon.  While in the ICU room he was standing and noted that he was feeling dizzy and lightheaded, and when he was going to grab a seat, the next thing that he remembers is being wheeled on the gurney.  He was told that wife caught him therefore he did not hit the floor and he was noted to have shaking-like movements and his lips were gray.  There was no tongue biting, no urinary incontinence and no no postictal confusion.  Patient reported history of syncope in elementary school and it was related to dehydration.  He had another event, reported that he passed out but this was in the setting of pericarditis in 2021.  Since being home he is back to go into his normal self, no additional syncope or seizure-like activity but report dry mouth and mild headache.  Handedness: Left handed   Onset: February 18  Seizure Type: unclear  Current frequency:   Any injuries from seizures: None   Seizure risk factors: Grandmother with seizure due to stroke   Previous ASMs: None   Currenty ASMs: None   ASMs side effects: N/A   Brain Images: Normal CT  Previous EEGs: None   OTHER MEDICAL CONDITIONS: Asthma   REVIEW OF SYSTEMS: Full 14 system review of systems performed and negative with  exception of: As noted in the HPI  ALLERGIES: No Known Allergies  HOME MEDICATIONS: Outpatient Medications Prior to Visit  Medication Sig Dispense Refill   ibuprofen (ADVIL) 200 MG tablet Take 200 mg by mouth every 6 (six) hours as needed.     No facility-administered medications prior to visit.    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 Bernestine Amass distal TI (5 cm) cobblestoning and tiny erosions, biopsies benign without evidence of IBD   COLONOSCOPY N/A 04/28/2015   RMR: Minimal anal canal hemorrhoids. patchy erythema of the rectum may or may not be significant. othewise normal appearing ileo-colonoscopy aside from an innocent appearing AVM- status  post terminal ileal biopsieis.    ESOPHAGOGASTRODUODENOSCOPY  01/14/2011   Dr. Vivi Ferns EGD, benign small bowel biopsy   LEFT HEART CATH AND CORONARY ANGIOGRAPHY N/A 09/24/2019   Procedure: LEFT HEART CATH AND CORONARY ANGIOGRAPHY;  Surgeon: Nelva Bush, MD;  Location: [place] [facility];  Service: Cardiovascular;  Laterality: N/A;    FAMILY HISTORY: Family History  Problem Relation Age of Onset   Hypertension Mother    Hypertension Father    Stroke Maternal Grandmother    Colon polyps  Maternal Grandmother 40       partial colectomy secondary to numerous polyps   Seizures Maternal Grandmother    Stomach cancer Maternal Grandfather 75       deceased   Heart disease Paternal Grandfather        late 25s, early 71s   Seizures Paternal Uncle    Crohn's disease Neg Hx    Liver disease Neg Hx     SOCIAL HISTORY: Social History   Socioeconomic History   Marital status: Married    Spouse name: Not on file   Number of children: 4   Years of education: 12   Highest education level: High school graduate  Occupational History   Occupation: [employer]:     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   Right-handed.   Caffeine use: one cup daily.    Social Determinants of Health   Financial Resource Strain: Not on file  Food Insecurity: Not on file  Transportation Needs: Not on file  Physical Activity: Not on file  Stress: Not on file  Social Connections: Not on file  Intimate Partner Violence: Not on file    PHYSICAL EXAM  GENERAL EXAM/CONSTITUTIONAL: Vitals:  Vitals:   04/26/21 1043  BP: (!) 141/99  Pulse: 79  Weight: 235 lb (106.6 kg)  Height: 5\' 8"  (1.727 m)   Body mass index is 35.73 kg/m. Wt Readings from Last 3 Encounters:  04/26/21 235 lb (106.6 kg)  04/21/21 235 lb (106.6 kg)  08/14/20 235 lb (106.6 kg)   Patient is in no distress; well developed, nourished and groomed; neck is supple  CARDIOVASCULAR: Examination of carotid arteries is normal; no carotid bruits Regular rate and rhythm, no murmurs Examination of peripheral vascular system by observation and palpation is normal  EYES: Pupils round and reactive to light, Visual fields  full to confrontation, Extraocular movements intacts,  No results found.  MUSCULOSKELETAL: Gait, strength, tone, movements noted in Neurologic exam below  NEUROLOGIC: MENTAL STATUS:  No flowsheet data found. awake, alert, oriented to person, place and time recent and remote memory intact normal attention and concentration language fluent, comprehension intact, naming intact fund of knowledge appropriate  CRANIAL NERVE:  2nd, 3rd, 4th, 6th - pupils equal and reactive to light, visual fields full to confrontation, extraocular muscles intact, no nystagmus 5th - facial sensation symmetric 7th - facial strength symmetric 8th - hearing intact 9th - palate elevates symmetrically, uvula midline 11th - shoulder shrug symmetric 12th - tongue protrusion midline  MOTOR:  normal bulk and tone, full strength in the BUE, BLE  SENSORY:  normal and symmetric to  light touch, pinprick, temperature, vibration  COORDINATION:  finger-nose-finger, fine finger movements normal  REFLEXES:  deep tendon reflexes present and symmetric  GAIT/STATION:  normal   DIAGNOSTIC DATA (LABS, IMAGING, TESTING) - I reviewed patient records, labs, notes, testing and imaging myself where available.  Lab Results  Component Value Date   WBC 13.5 (H) 04/21/2021   HGB 17.4 (H) 04/21/2021   HCT 50.3 04/21/2021   MCV 84.5 04/21/2021   PLT 225 04/21/2021      Component Value Date/Time   NA 138 04/21/2021 1354   NA 140 12/25/2010 1526   K 4.5 04/21/2021 1354   K 4.4 12/25/2010 1526   CL 106 04/21/2021 1354   CL 101 07/18/2010 1321   CO2 24 04/21/2021 1354   CO2 35 07/18/2010 1321   GLUCOSE 122 (H) 04/21/2021 1354   BUN 14 04/21/2021 1354   BUN 11 12/25/2010 1526   CREATININE 1.09 04/21/2021 1354   CREATININE 0.90 01/19/2015 0921   CALCIUM 9.0 04/21/2021 1354   CALCIUM 9.7 12/25/2010 1526   PROT 6.8 04/21/2021 1354   ALBUMIN 4.0 04/21/2021 1354   ALBUMIN 5.0 12/25/2010 1526   AST 20 04/21/2021  1354   AST 18 12/25/2010 1526   ALT 27 04/21/2021 1354   ALKPHOS 90 04/21/2021 1354   ALKPHOS 94 12/25/2010 1526   BILITOT 1.3 (H) 04/21/2021 1354   BILITOT 1.5 12/25/2010 1526   GFRNONAA >60 04/21/2021 1354   GFRNONAA >89 01/19/2015 0921   GFRAA >60 09/26/2019 0426   GFRAA >89 01/19/2015 0921   Lab Results  Component Value Date   CHOL 179 09/24/2019   HDL 22 (L) 09/24/2019   LDLCALC 117 (H) 09/24/2019   TRIG 201 (H) 09/24/2019   Lab Results  Component Value Date   HGBA1C 5.2 09/24/2019   No results found for: VITAMINB12 Lab Results  Component Value Date   TSH 2.200 09/24/2019    Head CT 2/18 No acute intracranial abnormality.   ASSESSMENT AND PLAN  31 y.o. year old male  with history of asthma who is presenting after syncope versus seizure like activity on February 18.  Patient reports on the specific day, he was not feeling well, had diarrhea all morning and was vomiting in the afternoon.  He did report prodrome of lightheadedness and dizziness prior to the episode and no possible Ictal confusion, no tongue biting and no urinary incontinence.  Patient likely had a vasovagal syncope, but cannot fully rule out seizures, therefore I will obtain a routine EEG.  I did advise him not to drive until I complete the EEG and if normal he can follow-up with his primary care doctor and return if worse.  He is comfortable with plan.   1. Syncope, unspecified syncope type   2. Seizure-like activity [facility])     Patient Instructions  Routine EEG, I will contact you to go over the result. No driving until EEG is completed Follow-up with your primary care doctor and return if worse.   Per [facility] statutes, patients with seizures are not allowed to drive until they have been seizure-free for six months.  Other recommendations include using caution when using heavy equipment or power tools. Avoid working on ladders or at heights. Take showers instead of baths.  Do not swim  alone.  Ensure the water temperature is not too high on the home water heater. Do not go swimming alone. Do not lock yourself in a room alone (i.e. bathroom). When caring for infants  or small children, sit down when holding, feeding, or changing them to minimize risk of injury to the child in the event you have a seizure. Maintain good sleep hygiene. Avoid alcohol.  Also recommend adequate sleep, hydration, good diet and minimize stress.   During the Seizure  - First, ensure adequate ventilation and place patients on the floor on their left side  Loosen clothing around the neck and ensure the airway is patent. If the patient is clenching the teeth, do not force the mouth open with any object as this can cause severe damage - Remove all items from the surrounding that can be hazardous. The patient may be oblivious to what's happening and may not even know what he or she is doing. If the patient is confused and wandering, either gently guide him/her away and block access to outside areas - Reassure the individual and be comforting - Call 911. In most cases, the seizure ends before EMS arrives. However, there are cases when seizures may last over 3 to 5 minutes. Or the individual may have developed breathing difficulties or severe injuries. If a pregnant patient or a person with diabetes develops a seizure, it is prudent to call an ambulance. - Finally, if the patient does not regain full consciousness, then call EMS. Most patients will remain confused for about 45 to 90 minutes after a seizure, so you must use judgment in calling for help. - Avoid restraints but make sure the patient is in a bed with padded side rails - Place the individual in a lateral position with the neck slightly flexed; this will help the saliva drain from the mouth and prevent the tongue from falling backward - Remove all nearby furniture and other hazards from the area - Provide verbal assurance as the individual is regaining  consciousness - Provide the patient with privacy if possible - Call for help and start treatment as ordered by the caregiver   After the Seizure (Postictal Stage)  After a seizure, most patients experience confusion, fatigue, muscle pain and/or a headache. Thus, one should permit the individual to sleep. For the next few days, reassurance is essential. Being calm and helping reorient the person is also of importance.  Most seizures are painless and end spontaneously. Seizures are not harmful to others but can lead to complications such as stress on the lungs, brain and the heart. Individuals with prior lung problems may develop labored breathing and respiratory distress.     Orders Placed This Encounter  Procedures   EEG adult    No orders of the defined types were placed in this encounter.   Return if symptoms worsen or fail to improve.  I have spent a total of 58 minutes dedicated to this patient today, preparing to see patient, examining the patient, ordering tests and/or medications, and counseling the patient including review of tests; performing a medically appropriate examination and evaluation; ordering medication, test, and procedures; counseling and educating the patient/family/caregiver; independent independently interpreting result and communicating results to the family/patient/caregiver; and documenting clinical information in the electronic medical record.   Alric Ran, MD 04/26/2021, 11:50 AM  [facility] [address] [place], [place] [address] [phone]

## 2021-04-26 NOTE — Patient Instructions (Signed)
Routine EEG, I will contact you to go over the result. No driving until EEG is completed Follow-up with your primary care doctor and return if worse.

## 2021-05-02 ENCOUNTER — Ambulatory Visit (INDEPENDENT_AMBULATORY_CARE_PROVIDER_SITE_OTHER): Payer: BC Managed Care – PPO | Admitting: Neurology

## 2021-05-02 DIAGNOSIS — R55 Syncope and collapse: Secondary | ICD-10-CM | POA: Diagnosis not present

## 2021-05-03 NOTE — Procedures (Signed)
? ? ?  History: ? ?31 year old man with seizure vs. Syncope  ? ?EEG classification: Awake and drowsy ? ?Description of the recording: The background rhythms of this recording consists of a fairly well modulated medium amplitude alpha rhythm of 10 Hz that is reactive to eye opening and closure. As the record progresses, the patient appears to remain in the waking state throughout the recording. Photic stimulation was performed, did not show any abnormalities. Hyperventilation was also performed, did not show any abnormalities. Toward the end of the recording, the patient enters the drowsy state with slight symmetric slowing seen. The patient never enters stage II sleep. No abnormal epileptiform discharges seen during this recording. There was no focal slowing. EKG monitor shows no evidence of cardiac rhythm abnormalities with a heart rate of 72. ? ?Impression: This is a normal EEG recording in the waking and drowsy state. No evidence of interictal epileptiform discharges seen. A normal EEG does not exclude a diagnosis of epilepsy.  ? ? ?Windell Norfolk, MD ?[facility] ?  ?

## 2021-05-06 ENCOUNTER — Telehealth: Payer: BC Managed Care – PPO | Admitting: Family

## 2021-05-06 DIAGNOSIS — J029 Acute pharyngitis, unspecified: Secondary | ICD-10-CM | POA: Diagnosis not present

## 2021-05-06 MED ORDER — AMOXICILLIN 500 MG PO CAPS: 500.0000 mg | ORAL_CAPSULE | Freq: Two times a day (BID) | ORAL | 0 refills | Status: AC

## 2021-05-06 NOTE — Progress Notes (Signed)
?Virtual Visit Consent  ? ?Gregory Reeves, you are scheduled for a virtual visit with a [facility] provider today.   ?  ?Just as with appointments in the office, your consent must be obtained to participate.  Your consent will be active for this visit and any virtual visit you may have with one of our providers in the next 365 days.   ?  ?If you have a MyChart account, a copy of this consent can be sent to you electronically.  All virtual visits are billed to your insurance company just like a traditional visit in the office.   ? ?As this is a virtual visit, video technology does not allow for your provider to perform a traditional examination.  This may limit your provider's ability to fully assess your condition.  If your provider identifies any concerns that need to be evaluated in person or the need to arrange testing (such as labs, EKG, etc.), we will make arrangements to do so.   ?  ?Although advances in technology are sophisticated, we cannot ensure that it will always work on either your end or our end.  If the connection with a video visit is poor, the visit may have to be switched to a telephone visit.  With either a video or telephone visit, we are not always able to ensure that we have a secure connection.    ? ?I need to obtain your verbal consent now.   Are you willing to proceed with your visit today?  ?  ?CHET GREENLEY has provided verbal consent on 05/06/2021 for a virtual visit (video or telephone). ?  ?[name], FNP  ? ?Date: 05/06/2021 12:26 PM ? ? ?Virtual Visit via Video Note  ? ?IJannifer Rodney, connected with  Gregory Reeves  ([id number], 22-Mar-1990) on 05/06/21 at 12:15 PM EST by a video-enabled telemedicine application and verified that I am speaking with the correct person using two identifiers. ? ?Location: ?Patient: Virtual Visit Location Patient: Home ?Provider: Virtual Visit Location Provider: Home Office ?  ?I discussed the limitations of evaluation and management by  telemedicine and the availability of in person appointments. The patient expressed understanding and agreed to proceed.   ? ?History of Present Illness: ?Gregory Reeves is a 31 y.o. who identifies as a male who was assigned male at birth, and is being seen today for sore throat. ? ?HPI: Sore Throat  ?This is a new problem. The current episode started yesterday. The problem has been unchanged. The maximum temperature recorded prior to his arrival was 102 - 102.9 F. The pain is at a severity of 6/10. The pain is mild. Associated symptoms include congestion, coughing, ear pain, headaches, swollen glands and trouble swallowing. Pertinent negatives include no shortness of breath. Associated symptoms comments: White patches on left tonsils . He has had no exposure to strep. He has tried acetaminophen and NSAIDs for the symptoms. The treatment provided mild relief.   ?Problems:  ?Patient Active Problem List  ? Diagnosis Date Noted  ? Symptomatic bradycardia 09/24/2019  ? Sinus node dysfunction (HCC) 09/24/2019  ? Chest pain of uncertain etiology 09/24/2019  ? Bradycardia 09/24/2019  ? Asystole (HCC) 09/24/2019  ? Abnormality of rectum   ? Rectal bleeding 12/05/2014  ? Allergic rhinitis 06/29/2012  ? Lower abdominal pain 07/09/2011  ? GERD (gastroesophageal reflux disease) 01/07/2011  ? RLQ abdominal pain 07/18/2010  ? Diarrhea 07/18/2010  ?  ?Allergies: No Known Allergies ?Medications:  ?Current Outpatient Medications:  ?  amoxicillin (AMOXIL) 500 MG capsule, Take 1 capsule (500 mg total) by mouth 2 (two) times daily for 10 days., Disp: 20 capsule, Rfl: 0 ?  ibuprofen (ADVIL) 200 MG tablet, Take 200 mg by mouth every 6 (six) hours as needed., Disp: , Rfl:  ? ?Observations/Objective: ?Patient is well-developed, well-nourished in no acute distress.  ?Resting comfortably  at home.  ?Head is normocephalic, atraumatic.  ?No labored breathing.  ?Speech is clear and coherent with logical content.  ?Patient is alert and oriented  at baseline.  ?Tonsils erythemas and swollen, white patches bilateral tonsils  ? ?Assessment and Plan: ?1. Acute pharyngitis, unspecified etiology ?- amoxicillin (AMOXIL) 500 MG capsule; Take 1 capsule (500 mg total) by mouth 2 (two) times daily for 10 days.  Dispense: 20 capsule; Refill: 0 ? ?- Take meds as prescribed ?- Use a cool mist humidifier  ?-Use saline nose sprays frequently ?-Force fluids ?-For any cough or congestion ? Use plain Mucinex- regular strength or max strength is fine ?-For fever or aces or pains- take tylenol or ibuprofen. ?-Throat lozenges if help ?-New toothbrush in 3 days ?-Follow up if symptoms worsen or do not improve  ? ?Follow Up Instructions: ?I discussed the assessment and treatment plan with the patient. The patient was provided an opportunity to ask questions and all were answered. The patient agreed with the plan and demonstrated an understanding of the instructions.  A copy of instructions were sent to the patient via MyChart unless otherwise noted below.  ? ? ? ?The patient was advised to call back or seek an in-person evaluation if the symptoms worsen or if the condition fails to improve as anticipated. ? ?Time:  ?I spent 6 minutes with the patient via telehealth technology discussing the above problems/concerns.   ? ?[name], FNP ? ?

## 2021-06-05 ENCOUNTER — Ambulatory Visit: Payer: BC Managed Care – PPO | Admitting: Internal Medicine

## 2021-10-18 ENCOUNTER — Emergency Department (HOSPITAL_COMMUNITY)
Admission: EM | Admit: 2021-10-18 | Discharge: 2021-10-18 | Disposition: A | Discharge status: Home / Self Care | Payer: BC Managed Care – PPO | Attending: Emergency Medicine | Admitting: Emergency Medicine

## 2021-10-18 ENCOUNTER — Encounter (HOSPITAL_COMMUNITY): Payer: Self-pay | Admitting: Emergency Medicine

## 2021-10-18 ENCOUNTER — Other Ambulatory Visit: Payer: Self-pay

## 2021-10-18 DIAGNOSIS — R55 Syncope and collapse: Secondary | ICD-10-CM | POA: Diagnosis not present

## 2021-10-18 DIAGNOSIS — R42 Dizziness and giddiness: Secondary | ICD-10-CM | POA: Insufficient documentation

## 2021-10-18 DIAGNOSIS — Z955 Presence of coronary angioplasty implant and graft: Secondary | ICD-10-CM | POA: Diagnosis not present

## 2021-10-18 DIAGNOSIS — R079 Chest pain, unspecified: Secondary | ICD-10-CM | POA: Diagnosis not present

## 2021-10-18 LAB — CBC WITH DIFFERENTIAL/PLATELET
Abs Immature Granulocytes: 0.04 10*3/uL (ref 0.00–0.07)
Basophils Absolute: 0 10*3/uL (ref 0.0–0.1)
Basophils Relative: 0 %
Eosinophils Absolute: 0.2 10*3/uL (ref 0.0–0.5)
Eosinophils Relative: 2 %
HCT: 45.3 % (ref 39.0–52.0)
Hemoglobin: 15.6 g/dL (ref 13.0–17.0)
Immature Granulocytes: 0 %
Lymphocytes Relative: 21 %
Lymphs Abs: 1.9 10*3/uL (ref 0.7–4.0)
MCH: 29 pg (ref 26.0–34.0)
MCHC: 34.4 g/dL (ref 30.0–36.0)
MCV: 84.2 fL (ref 80.0–100.0)
Monocytes Absolute: 0.4 10*3/uL (ref 0.1–1.0)
Monocytes Relative: 5 %
Neutro Abs: 6.6 10*3/uL (ref 1.7–7.7)
Neutrophils Relative %: 72 %
Platelets: 280 10*3/uL (ref 150–400)
RBC: 5.38 MIL/uL (ref 4.22–5.81)
RDW: 12.9 % (ref 11.5–15.5)
WBC: 9.2 10*3/uL (ref 4.0–10.5)
nRBC: 0 % (ref 0.0–0.2)

## 2021-10-18 LAB — MAGNESIUM: Magnesium: 2.3 mg/dL (ref 1.7–2.4)

## 2021-10-18 LAB — BASIC METABOLIC PANEL
Anion gap: 5 (ref 5–15)
BUN: 13 mg/dL (ref 6–20)
CO2: 26 mmol/L (ref 22–32)
Calcium: 8.8 mg/dL — ABNORMAL LOW (ref 8.9–10.3)
Chloride: 107 mmol/L (ref 98–111)
Creatinine, Ser: 0.92 mg/dL (ref 0.61–1.24)
GFR, Estimated: >60 mL/min (ref >60)
Glucose, Bld: 105 mg/dL — ABNORMAL HIGH (ref 70–99)
Potassium: 3.8 mmol/L (ref 3.5–5.1)
Sodium: 138 mmol/L (ref 135–145)

## 2021-10-18 LAB — TROPONIN I (HIGH SENSITIVITY)
Troponin I (High Sensitivity): 2 ng/L (ref <18)
Troponin I (High Sensitivity): <2 ng/L (ref <18)

## 2021-10-18 MED ORDER — MIDODRINE HCL 5 MG PO TABS: 5.0000 mg | ORAL_TABLET | Freq: Three times a day (TID) | ORAL | 0 refills | Status: DC | PRN

## 2021-10-18 NOTE — ED Provider Notes (Signed)
[facility] EMERGENCY DEPARTMENT Provider Note   CSN: [id number] Arrival date & time: 10/18/21  1232     History  Chief Complaint  Patient presents with   Loss of Consciousness    Gregory Reeves is a 31 y.o. male presenting to ED with LOC.  Patient reports he was in his office working today at rest and he had several episodes he became lightheaded and briefly lost consciousness.  He is not certain how long he lost consciousness.  His father witnessed the episodes and said the patient reported feeling woozy and then "his face turned red" and he looked to have LOC, lasting "only a second or two," then awake.  His wife says he has had similar episodes in the past, proximately once every 6 months, generally at rest, they will last a few minutes and he appears to revive afterwards.  From a review of external records the patient did have a 30-day cardiac monitor in July of last year, with data available only for 18% of the planned monitor time, no significant arrhythmias noted.  Prior to that he had an echocardiogram in August 2021 which showed "good pumping function of the heart", normal EF, no leaky valves.  Overall it was unremarkable scan.  Prior to that in 2021, he had a left heart catheterization, due to episodes of bradycardia, with no clinically significant coronary disease noted.  He was seen by electrophysiology at that time in July 2021, where there were concerns for pericarditis.   He is not currently experiencing similar chest pain to that event.  Seen again by EP in Feb for syncope and felt to be experiencing vasovagal episodes, not requiring hospitalization at that time.  HPI     Home Medications Prior to Admission medications   Medication Sig Start Date End Date Taking? Authorizing Provider  ibuprofen (ADVIL) 200 MG tablet Take 200 mg by mouth every 6 (six) hours as needed for fever, mild pain or headache.   Yes [provider]  midodrine (PROAMATINE) 5 MG  tablet Take 1 tablet (5 mg total) by mouth 3 (three) times daily as needed for up to 21 doses. Take if lightheaded 10/18/21  Yes Sanjit Mcmichael, Kermit Balo, MD      Allergies    Patient has no known allergies.    Review of Systems   Review of Systems  Physical Exam Updated Vital Signs BP (!) 136/92   Pulse 65   Temp 98.2 F (36.8 C) (Oral)   Resp 17   Ht 5\' 8"  (1.727 m)   Wt 122.5 kg   SpO2 98%   BMI 41.05 kg/m  Physical Exam Constitutional:      General: He is not in acute distress. HENT:     Head: Normocephalic and atraumatic.  Eyes:     Conjunctiva/sclera: Conjunctivae normal.     Pupils: Pupils are equal, round, and reactive to light.  Cardiovascular:     Rate and Rhythm: Normal rate and regular rhythm.     Pulses: Normal pulses.  Pulmonary:     Effort: Pulmonary effort is normal. No respiratory distress.  Abdominal:     General: There is no distension.     Tenderness: There is no abdominal tenderness.  Skin:    General: Skin is warm and dry.  Neurological:     General: No focal deficit present.     Mental Status: He is alert. Mental status is at baseline.  Psychiatric:        Mood and  Affect: Mood normal.        Behavior: Behavior normal.     ED Results / Procedures / Treatments   Labs (all labs ordered are listed, but only abnormal results are displayed) Labs Reviewed  BASIC METABOLIC PANEL - Abnormal; Notable for the following components:      Result Value   Glucose, Bld 105 (*)    Calcium 8.8 (*)    All other components within normal limits  CBC WITH DIFFERENTIAL/PLATELET  MAGNESIUM  TROPONIN I (HIGH SENSITIVITY)  TROPONIN I (HIGH SENSITIVITY)    EKG EKG Interpretation  Date/Time:  Thursday October 18 2021 12:53:34 EDT Ventricular Rate:  85 PR Interval:  154 QRS Duration: 88 QT Interval:  368 QTC Calculation: 437 R Axis:   89 Text Interpretation: Normal sinus rhythm Normal ECG When compared with ECG of 21-Apr-2021 12:41, PREVIOUS ECG IS PRESENT  Confirmed by Octaviano Glow [phone]) on 10/18/2021 1:09:21 PM  Radiology No results found.  Procedures Procedures    Medications Ordered in ED Medications - No data to display  ED Course/ Medical Decision Making/ A&P Clinical Course as of 10/18/21 1654  Thu Oct 18, 2021  1541 Syncope, pending delta trop [MK]    Clinical Course User Index [MK] Kommor, Debe Coder, MD                           Medical Decision Making Amount and/or Complexity of Data Reviewed Labs: ordered.  Risk Prescription drug management.   This patient presents to the ED with concern for several episodes of syncope versus near syncope. This involves an extensive number of treatment options, and is a complaint that carries with it a high risk of complications and morbidity.  The differential diagnosis includes arrhythmias versus vasovagal episodes versus other  Additional history obtained from the wife at bedside  External records from outside source obtained and reviewed including cardiac work-up including left heart catheterization, cardiac MRI in 2021, EP evaluation, tele monitor July 2022  The patient was evaluated in February of this year with a very similar episode of syncope or near syncope where he turned "pale and looked unwell".  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  I discussed the mechanism, pathophysiology, and treatment for the patient.  Of asked him to refrain from alcohol or tobacco or caffeine.  I have asked him to avoid missing meals and to stay well-hydrated and liberalize his salt intake.  I have asked him to lie down when he feels a spill occurring.  If his spells become more frequent we would consider adding Florinef or midodrine.  He can be discharged home.  Hospitalization is not indicated at this time.  I will see him back in the office  in 6 weeks.  I ordered and personally interpreted labs.  The pertinent results include:  initial trop neg, labs unremarkable  No indication for xray imaging at this time.  Doubt PTX, PNA, PE, Aortic dissection.  With normal BP and transient symptoms, doubt cardiac tamponade.  The patient was maintained on a cardiac monitor.  I personally viewed and interpreted the cardiac monitored which showed an underlying rhythm of: Sinus rhythm  Per my interpretation the patient's ECG shows normal sinus rhythm with no acute ischemic findings, no evidence of channel apathy or arrhythmia, no heart block  I have reviewed  the patients home medicines and have made adjustments as needed  After the interventions noted above, I reevaluated the patient and found that they have: stayed the same  Patient remains asymptomatic throughout 4 hour observation in ED, with no acute events on telemetry.  I suspect he is experiencing another vasovagal episode.  Given his robust cardiac workup, including echo, LHC, long term holter monitoring, and cardiac MRI in the past 2 years, I do not see an indication to repeat these tests.  Hospitalization was considered, but I felt it was reasonable to discharge the patient without EP outpatient f/u.  Per the last EP evaluation in Feb, as noted above, we can provide PRN midodrine if he becomes symptomatic again.  His normal BP appears stable.   His father and wife were both present for discussion and discharge planning, and all in agreement.  Dispostion:  After consideration of the diagnostic results and the patients response to treatment, I feel that the patent would benefit from outpatient f/u.         Final Clinical Impression(s) / ED Diagnoses Final diagnoses:  Near syncope    Rx / DC Orders ED Discharge Orders          Ordered    midodrine (PROAMATINE) 5 MG tablet  3 times daily PRN        10/18/21 1528              Terald Sleeper, MD 10/18/21  1656

## 2021-10-18 NOTE — Discharge Instructions (Addendum)
I prescribed a medicine called midodrine which you will take again if you begin to feel lightheaded as you have in the past before passing out.  This could be a symptom that your blood pressure is low.  He should lie yourself flat if possible, try to relax, and take 1 of these pills.  Midodrine is a blood pressure medicine will raise your blood pressure.  Please call and schedule a follow-up appointment with the electrophysiologist at the number above.  This is the doctor saw you in the past for your episodes of lightheadedness.  They may wish to make adjustments to your medications.

## 2021-10-18 NOTE — ED Provider Notes (Signed)
  Physical Exam  BP (!) 136/92   Pulse 65   Temp 98.2 F (36.8 C) (Oral)   Resp 17   Ht 5\' 8"  (1.727 m)   Wt 122.5 kg   SpO2 98%   BMI 41.05 kg/m   Physical Exam Constitutional:      General: He is not in acute distress.    Appearance: Normal appearance.  HENT:     Head: Normocephalic and atraumatic.     Nose: No congestion or rhinorrhea.  Eyes:     General:        Right eye: No discharge.        Left eye: No discharge.     Extraocular Movements: Extraocular movements intact.     Pupils: Pupils are equal, round, and reactive to light.  Cardiovascular:     Rate and Rhythm: Normal rate and regular rhythm.     Heart sounds: No murmur heard. Pulmonary:     Effort: No respiratory distress.     Breath sounds: No wheezing or rales.  Abdominal:     General: There is no distension.     Tenderness: There is no abdominal tenderness.  Musculoskeletal:        General: Normal range of motion.     Cervical back: Normal range of motion.  Skin:    General: Skin is warm and dry.  Neurological:     General: No focal deficit present.     Mental Status: He is alert.     Procedures  Procedures  ED Course / MDM   Clinical Course as of 10/18/21 2334  Thu Oct 18, 2021  1541 Syncope, pending delta trop [MK]    Clinical Course User Index [MK] Oct 20, 2021, MD   Medical Decision Making Amount and/or Complexity of Data Reviewed Labs: ordered.  Risk Prescription drug management.   Patient received in handoff.  Patient with frequent presentations for syncope presumed to be vasovagal in nature.  Ending delta Trope at time of signout with plan for discharge if delta Trope negative.  Patient delta Glendora Score is negative and he was discharged       Theodis Aguas, MD 10/18/21 2334

## 2021-10-18 NOTE — ED Triage Notes (Signed)
Pt presents with multiple syncopal episodes, pt has heart monitor d/t pauses in heart beats.

## 2021-10-24 DIAGNOSIS — M542 Cervicalgia: Secondary | ICD-10-CM | POA: Diagnosis not present

## 2021-10-24 DIAGNOSIS — M9901 Segmental and somatic dysfunction of cervical region: Secondary | ICD-10-CM | POA: Diagnosis not present

## 2021-10-24 DIAGNOSIS — M9902 Segmental and somatic dysfunction of thoracic region: Secondary | ICD-10-CM | POA: Diagnosis not present

## 2021-11-08 IMAGING — MR MR CARD MORPHOLOGY WO/W CM
45 of 48 series · 45 of 48 positions shown · IV contrast (gadavist)
Comparison: none

CLINICAL DATA: 29-year-old male with h/o syncope and sinus arrest
with 27 second pause.

EXAM:
CARDIAC MRI
TECHNIQUE: The patient was scanned on a 1.5 Tesla GE magnet. A dedicated
cardiac coil was used. Functional imaging was done using Fiesta
sequences. [DATE], and 4 chamber views were done to assess for RWMA's.
Modified Tavakkul rule using a short axis stack was used to
calculate an ejection fraction on a dedicated work station using
Circle software. The patient received 11 cc of Gadavist. After 10
minutes inversion recovery sequences were used to assess for
infiltration and scar tissue.
CONTRAST:  11 cc  of Gadavist

[Series 4: [id number]_bh · axial · 8.0mm · 1.48mm/px · 1 of 16 slices shown]
[im 1/16]
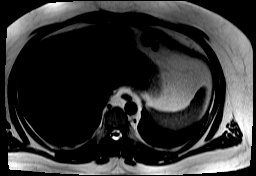

[Series 8: bSSFP · oblique · 8.0mm · 1.61mm/px · 1 of 25 slices shown (1 of 27)]
[im 1/25]
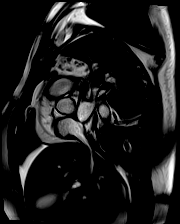

[Series 9: bSSFP · oblique · 8.0mm · 1.61mm/px · 1 of 25 slices shown (2 of 27)]
[im 1/25]
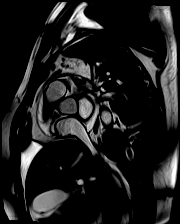

[Series 10: bSSFP · oblique · 8.0mm · 1.61mm/px · 1 of 25 slices shown (3 of 27)]
[im 1/25]
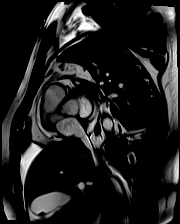

[Series 11: bSSFP · oblique · 8.0mm · 1.61mm/px · 1 of 25 slices shown (4 of 27)]
[im 1/25]
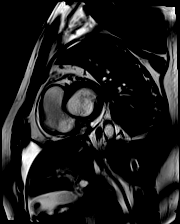

[Series 12: bSSFP · oblique · 8.0mm · 1.61mm/px · 1 of 25 slices shown (5 of 27)]
[im 1/25]
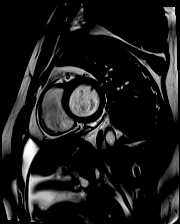

[Series 13: bSSFP · oblique · 8.0mm · 1.61mm/px · 1 of 25 slices shown (6 of 27)]
[im 1/25]
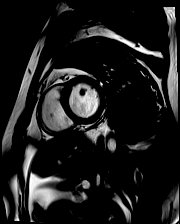

[Series 14: bSSFP · oblique · 8.0mm · 1.61mm/px · 1 of 25 slices shown (7 of 27)]
[im 1/25]
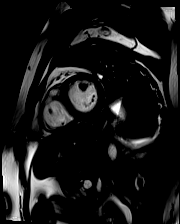

[Series 15: bSSFP · oblique · 8.0mm · 1.61mm/px · 1 of 25 slices shown (8 of 27)]
[im 1/25]
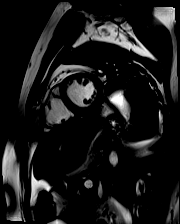

[Series 16: bSSFP · oblique · 8.0mm · 1.61mm/px · 1 of 25 slices shown (9 of 27)]
[im 1/25]
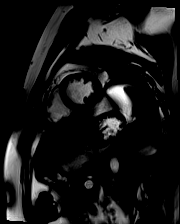

[Series 17: bSSFP · oblique · 8.0mm · 1.61mm/px · 1 of 25 slices shown (10 of 27)]
[im 1/25]
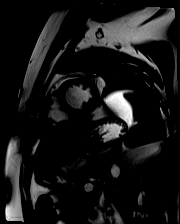

[Series 18: bSSFP · oblique · 8.0mm · 1.61mm/px · 1 of 25 slices shown (11 of 27)]
[im 1/25]
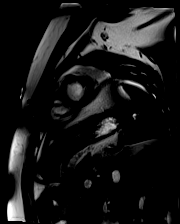

[Series 19: bSSFP · oblique · 8.0mm · 1.61mm/px · 1 of 25 slices shown (12 of 27)]
[im 1/25]
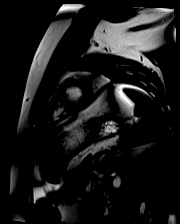

[Series 20: bSSFP · oblique · 8.0mm · 1.61mm/px · 1 of 25 slices shown (13 of 27)]
[im 1/25]
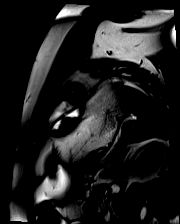

[Series 21: bSSFP · oblique · 8.0mm · 1.61mm/px · 1 of 25 slices shown (14 of 27)]
[im 1/25]
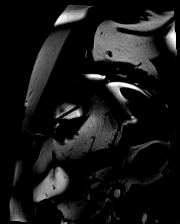

[Series 22: bSSFP · oblique · 8.0mm · 1.61mm/px · 1 of 25 slices shown (15 of 27)]
[im 1/25]
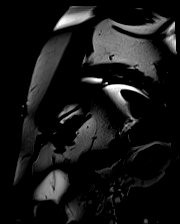

[Series 23: bSSFP · oblique · 8.0mm · 1.61mm/px · 1 of 25 slices shown (16 of 27)]
[im 1/25]
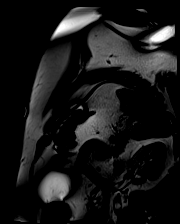

[Series 24: STIR · oblique · 8.0mm · 1.92mm/px · 1 of 16 slices shown]
[im 1/16]
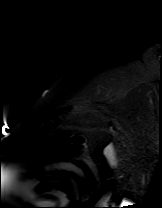

[Series 25: (id)_long_t1 · oblique · 8.0mm · 1.56mm/px · 1 of 24 slices shown]
[im 1/24]
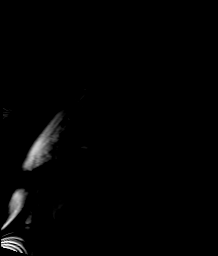

[Series 26: (id)_long_t1_moco · oblique · 8.0mm · 1.56mm/px · 1 of 24 slices shown]
[im 1/24]
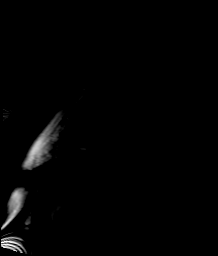

[Series 27: (id)_long_t1_moco_t1 · oblique · 8.0mm · 1.56mm/px · 1 of 3 slices shown]
[im 1/3]
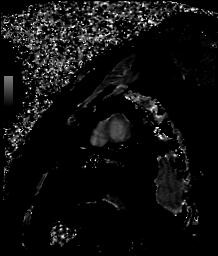

[Series 29: (id)_trufi · oblique · 8.0mm · 2.08mm/px · 1 of 9 slices shown]
[im 1/9]
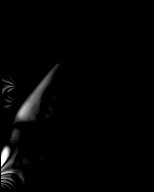

[Series 30: (id)_trufi_moco · oblique · 8.0mm · 2.08mm/px · 1 of 9 slices shown]
[im 1/9]
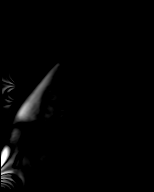

[Series 31: (id)_trufi_moco_t2 · oblique · 8.0mm · 2.08mm/px · 1 of 3 slices shown]
[im 1/3]
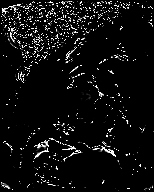

[Series 33: bSSFP · oblique · 6.0mm · 1.41mm/px · 1 of 25 slices shown (17 of 27)]
[im 1/25]
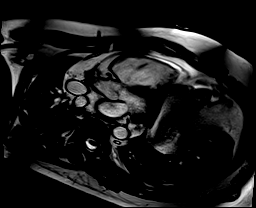

[Series 34: bSSFP · oblique · 6.0mm · 1.48mm/px · 1 of 25 slices shown (18 of 27)]
[im 1/25]
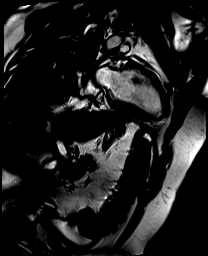

[Series 35: bSSFP · oblique · 6.0mm · 1.41mm/px · 1 of 25 slices shown (19 of 27)]
[im 1/25]
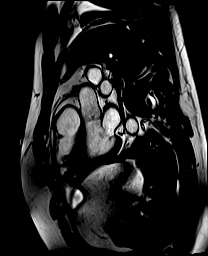

[Series 36: bSSFP · axial · 6.0mm · 1.41mm/px · 1 of 25 slices shown (20 of 27)]
[im 1/25]
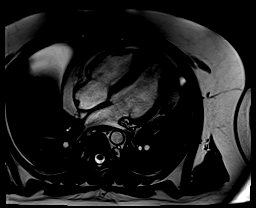

[Series 37: bSSFP · oblique · 6.0mm · 1.92mm/px · 1 of 15 slices shown (21 of 27)]
[im 1/15]
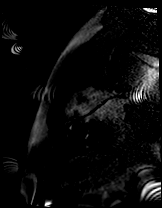

[Series 38: bSSFP · oblique · 6.0mm · 1.92mm/px · 1 of 15 slices shown (22 of 27)]
[im 1/15]
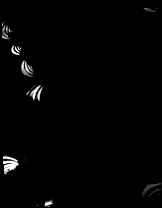

[Series 40: lge_single shot sa · oblique · 8.0mm · 1.98mm/px · 1 of 10 slices shown (1 of 2)]
[im 1/10]
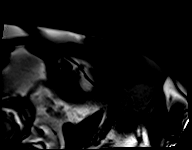

[Series 41: lge_single shot sa · oblique · 8.0mm · 1.98mm/px · 1 of 10 slices shown (2 of 2)]
[im 1/10]
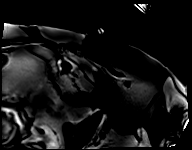

[Series 44: lge_single shot 4 · axial · 6.0mm · 1.98mm/px · 1 of 1 slices shown (1 of 2)]
[im 1/1]
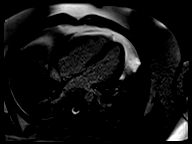

[Series 45: lge_single shot 4 · axial · 6.0mm · 1.98mm/px · 1 of 1 slices shown (2 of 2)]
[im 1/1]
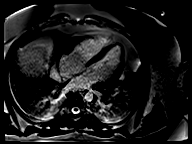

[Series 48: (id)_short_t1 · oblique · 8.0mm · 1.56mm/px · 1 of 27 slices shown]
[im 1/27]
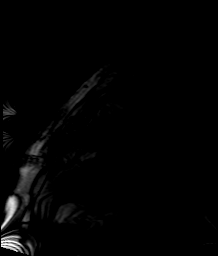

[Series 49: (id)_short_t1_moco · oblique · 8.0mm · 1.56mm/px · 1 of 27 slices shown]
[im 1/27]
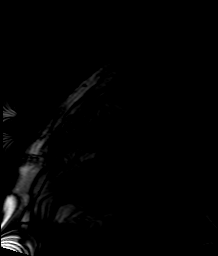

[Series 50: (id)_short_t1_moco_t1 · oblique · 8.0mm · 1.56mm/px · 1 of 3 slices shown]
[im 1/3]
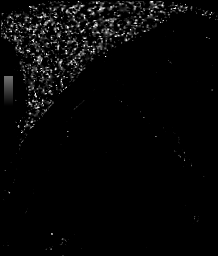

[Series 53: bSSFP · oblique · 6.0mm · 1.92mm/px · 1 of 15 slices shown (23 of 27)]
[im 1/15]
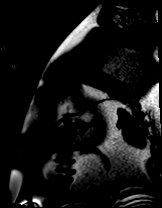

[Series 54: bSSFP · oblique · 6.0mm · 1.92mm/px · 1 of 15 slices shown (24 of 27)]
[im 1/15]
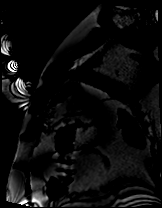

[Series 57: bSSFP · axial · 6.0mm · 1.73mm/px · 1 of 1 slices shown (25 of 27)]
[im 1/1]
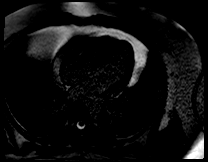

[Series 58: bSSFP · axial · 6.0mm · 1.73mm/px · 1 of 1 slices shown (26 of 27)]
[im 1/1]
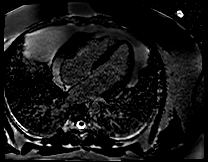

[Series 61: bSSFP · coronal · 6.0mm · 1.41mm/px · 1 of 25 slices shown (27 of 27)]
[im 1/25]
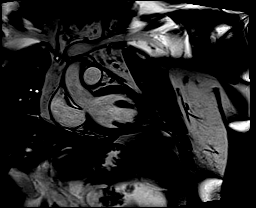

[Series 62: aortic valve cine · oblique · 6.0mm · 1.41mm/px · 1 of 25 slices shown]
[im 1/25]
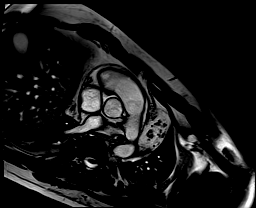

[Series 63: cine rvit · coronal · 6.0mm · 1.48mm/px · 1 of 25 slices shown]
[im 1/25]
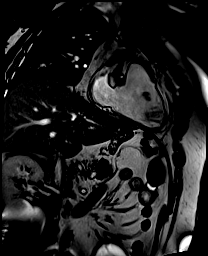

[Series 64: cine rvot · sagittal · 6.0mm · 1.41mm/px · 1 of 25 slices shown]
[im 1/25]
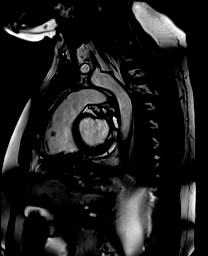

[45 of 48 positions shown; findings below may reference images not displayed]

FINDINGS: 1. Normal left ventricular size with mild concentric hypertrophy and
normal systolic function (LVEF = 56%). There are no regional wall
motion abnormalities and no late gadolinium enhancement in the left
ventricular myocardium.

LVEDD: 50 mm

LVESD: 36 mm

LVEDV: 125 ml

LVESV: 56 ml

SV: 69 ml

CO: 6.0 L/min

Myocardial mass: 108 g

2. Normal right ventricular size, thickness and systolic function
(LVEF = 52%). There are no regional wall motion abnormalities.

3.  Normal left and right atrial size.

4. Normal size of the aortic root, ascending aorta and pulmonary
artery.

5. Mild aortic and trivial mitral and tricuspid regurgitation.
Aortic valve is tricuspid.

6.  Normal pericardium.  Trivial pericardial effusion.
IMPRESSION: 1. Normal left ventricular size with mild concentric hypertrophy and
normal systolic function (LVEF = 56%). There are no regional wall
motion abnormalities and no late gadolinium enhancement in the left
ventricular myocardium.

2. Normal right ventricular size, thickness and systolic function
(LVEF = 52%). There are no regional wall motion abnormalities.

3.  Normal left and right atrial size.

4. Normal size of the aortic root, ascending aorta and pulmonary
artery.

5. Mild aortic and trivial mitral and tricuspid regurgitation.
Aortic valve is tricuspid.

6.  Normal pericardium.  Trivial pericardial effusion.

There is no evidence for infiltrative or inflammatory
cardiomyopathy, also no evidence for pericarditis or
myopericarditis.

## 2021-12-05 ENCOUNTER — Ambulatory Visit: Payer: BC Managed Care – PPO | Admitting: Family Medicine

## 2021-12-05 ENCOUNTER — Encounter: Payer: Self-pay | Admitting: Family Medicine

## 2021-12-05 VITALS — BP 130/82 | HR 77 | Temp 98.4°F | Ht 68.0 in | Wt 248.0 lb

## 2021-12-05 DIAGNOSIS — E559 Vitamin D deficiency, unspecified: Secondary | ICD-10-CM | POA: Diagnosis not present

## 2021-12-05 DIAGNOSIS — Z0000 Encounter for general adult medical examination without abnormal findings: Secondary | ICD-10-CM | POA: Diagnosis not present

## 2021-12-05 DIAGNOSIS — R7301 Impaired fasting glucose: Secondary | ICD-10-CM

## 2021-12-05 DIAGNOSIS — Z13220 Encounter for screening for lipoid disorders: Secondary | ICD-10-CM

## 2021-12-05 DIAGNOSIS — E785 Hyperlipidemia, unspecified: Secondary | ICD-10-CM | POA: Diagnosis not present

## 2021-12-05 NOTE — Progress Notes (Signed)
   Subjective:    Patient ID: Gregory Reeves, male    DOB: 1990-10-15, 31 y.o.   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  In addition to this he finds himself feeling rundown at times he has had some weight issues he is trying to watch his weight trying to watch his diet we did discuss healthy eating and healthy liquids Patient does not smoke or drink He has had intermittent syncope spells these have gotten worse since he had COVID-vaccine He states he had seen Dr. Lovena Le and Dr. Harl Bowie previously He is uncertain if there is anything else that can be done to help these He has had a couple other spells this year that he had to go to the ER because of arrhythmia issues/presyncope issues  Review of Systems     Objective:   Physical Exam General-in no acute distress Eyes-no discharge Lungs-respiratory rate normal, CTA CV-no murmurs,RRR Extremities skin warm dry no edema Neuro grossly normal Behavior normal, alert  Blood pressure sitting standing good      Assessment & Plan:  1. Well adult exam Adult wellness-complete.wellness physical was conducted today. Importance of diet and exercise were discussed in detail.  Importance of stress reduction and healthy living were discussed.  In addition to this a discussion regarding safety was also covered.  We also reviewed over immunizations and gave recommendations regarding current immunization needed for age.   In addition to this additional areas were also touched on including: Preventative health exams needed:  Colonoscopy he has had previous colonoscopy which was negative he does have internal hemorrhoids occasionally has blood in the  stool he was told that if this gets worse he will need to follow-up with gastroenterology  Patient was advised yearly wellness exam  - VITAMIN D 25 Hydroxy (Vit-D Deficiency, Fractures) - Parathyroid hormone, intact (no Ca) - Glucose, Random - Hemoglobin A1c - Lipid panel  2. Hypocalcemia Calcium low check vitamin D check PTH - VITAMIN D 25 Hydroxy (Vit-D Deficiency, Fractures) - Parathyroid hormone, intact (no Ca) - Glucose, Random - Hemoglobin A1c - Lipid panel  3. Screening, lipid Screening - VITAMIN D 25 Hydroxy (Vit-D Deficiency, Fractures) - Parathyroid hormone, intact (no Ca) - Glucose, Random - Hemoglobin A1c - Lipid panel  4. Fasting hyperglycemia A1c ordered - VITAMIN D 25 Hydroxy (Vit-D Deficiency, Fractures) - Parathyroid hormone, intact (no Ca) - Glucose, Random - Hemoglobin A1c - Lipid panel Syncope events with questionable arrhythmia it would be advisable for patient to do a follow-up with cardiology possibly they can do a loop recorder

## 2021-12-06 LAB — LIPID PANEL
Chol/HDL Ratio: 9.8 ratio — ABNORMAL HIGH (ref 0.0–5.0)
Cholesterol, Total: 195 mg/dL (ref 100–199)
HDL: 20 mg/dL — ABNORMAL LOW (ref >39)
LDL Chol Calc (NIH): 135 mg/dL — ABNORMAL HIGH (ref 0–99)
Triglycerides: 222 mg/dL — ABNORMAL HIGH (ref 0–149)
VLDL Cholesterol Cal: 40 mg/dL (ref 5–40)

## 2021-12-06 LAB — PARATHYROID HORMONE, INTACT (NO CA): PTH: 26 pg/mL (ref 15–65)

## 2021-12-06 LAB — HEMOGLOBIN A1C
Est. average glucose Bld gHb Est-mCnc: 111 mg/dL
Hgb A1c MFr Bld: 5.5 % (ref 4.8–5.6)

## 2021-12-06 LAB — GLUCOSE, RANDOM: Glucose: 84 mg/dL (ref 70–99)

## 2021-12-06 LAB — VITAMIN D 25 HYDROXY (VIT D DEFICIENCY, FRACTURES): Vit D, 25-Hydroxy: 23.2 ng/mL — ABNORMAL LOW (ref 30.0–100.0)

## 2021-12-08 ENCOUNTER — Telehealth: Payer: Self-pay | Admitting: Family Medicine

## 2021-12-08 NOTE — Telephone Encounter (Signed)
Nurses-please let patient know that I did communicate with Dr. Lovena Le cardiology.  He states that he would be willing to see the patient for further evaluation regarding his passing out spells.  Please go ahead and start referral for Dr. Crissie Sickles cardiology due to syncope issues thank you

## 2021-12-10 ENCOUNTER — Other Ambulatory Visit: Payer: Self-pay

## 2021-12-10 DIAGNOSIS — R55 Syncope and collapse: Secondary | ICD-10-CM

## 2021-12-10 NOTE — Telephone Encounter (Signed)
Contacted patient to let him know per referral information.

## 2021-12-11 MED ORDER — VITAMIN D (ERGOCALCIFEROL) 1.25 MG (50000 UNIT) PO CAPS: 50000.0000 [IU] | ORAL_CAPSULE | ORAL | 3 refills | Status: AC

## 2021-12-11 NOTE — Addendum Note (Signed)
Addended by: Dairl Ponder on: 12/11/2021 02:22 PM   Modules accepted: Orders

## 2022-01-01 ENCOUNTER — Ambulatory Visit (INDEPENDENT_AMBULATORY_CARE_PROVIDER_SITE_OTHER): Payer: BC Managed Care – PPO | Admitting: Nurse Practitioner

## 2022-01-01 ENCOUNTER — Encounter: Payer: Self-pay | Admitting: Nurse Practitioner

## 2022-01-01 VITALS — BP 134/85 | Temp 98.3°F | Ht 68.0 in | Wt 248.0 lb

## 2022-01-01 DIAGNOSIS — R0981 Nasal congestion: Secondary | ICD-10-CM | POA: Diagnosis not present

## 2022-01-01 DIAGNOSIS — R42 Dizziness and giddiness: Secondary | ICD-10-CM

## 2022-01-01 MED ORDER — ONDANSETRON 4 MG PO TBDP: 4.0000 mg | ORAL_TABLET | Freq: Three times a day (TID) | ORAL | 0 refills | Status: DC | PRN

## 2022-01-01 NOTE — Progress Notes (Signed)
Subjective:    Patient ID: Gregory Reeves, male    DOB: 1990/10/10, 31 y.o.   MRN: [id number]  Cough This is a new problem. The current episode started in the past 7 days. Associated symptoms include nasal congestion and a sore throat.   31 year old with history of bradycardia, syncope, and chest pain present to the clinic with complaint of sinus congestion, sore throat, and cough x4 days. Patient then states that he began to experience vertigo about 1 day ago. Patient states that vertigo is caused by quick head movements.   Patient denies any fevers, body aches, chills, difficulty breathing, chest pain, weakness, facial asymmetry. Patient does admit to occasional nausea due to vertigo. Patient does admit that one of his children tested positive for RSV.   Review of Systems  HENT:  Positive for sore throat.   Respiratory:  Positive for cough.        Objective:   Physical Exam Vitals reviewed.  Constitutional:      General: He is not in acute distress.    Appearance: Normal appearance. He is normal weight. He is not ill-appearing, toxic-appearing or diaphoretic.  HENT:     Head: Normocephalic and atraumatic.     Right Ear: Tympanic membrane, ear canal and external ear normal.     Left Ear: Tympanic membrane, ear canal and external ear normal.     Ears:     Comments: Mild bilateral effusion. No erythema noted.     Nose: Congestion present. No rhinorrhea.     Mouth/Throat:     Mouth: Mucous membranes are moist.     Pharynx: Oropharynx is clear. No oropharyngeal exudate or posterior oropharyngeal erythema.  Eyes:     General: No scleral icterus.       Right eye: No discharge.        Left eye: No discharge.     Extraocular Movements: Extraocular movements intact.     Conjunctiva/sclera: Conjunctivae normal.     Pupils: Pupils are equal, round, and reactive to light.  Cardiovascular:     Rate and Rhythm: Normal rate and regular rhythm.     Pulses: Normal pulses.     Heart  sounds: Normal heart sounds. No murmur heard. Pulmonary:     Effort: Pulmonary effort is normal. No respiratory distress.     Breath sounds: Normal breath sounds. No wheezing.  Musculoskeletal:     Cervical back: Normal range of motion and neck supple. No rigidity or tenderness.     Comments: Grossly intact  Lymphadenopathy:     Cervical: No cervical adenopathy.  Skin:    General: Skin is warm.     Capillary Refill: Capillary refill takes less than 2 seconds.  Neurological:     General: No focal deficit present.     Mental Status: He is alert.     Cranial Nerves: No cranial nerve deficit.     Sensory: No sensory deficit.     Motor: No weakness.     Coordination: Coordination normal.  Psychiatric:        Mood and Affect: Mood normal.        Behavior: Behavior normal.           Assessment & Plan:   1. Nasal congestion - Suspect viral etiology - COVID-19, Flu A+B and RSV - May use over the counter antihistamine - Mucinex (no DM) -Continue to use nasal saline sprays - RTC if symptoms do not Improve or if they worsen. -Most  viral illnesses resolve within 5-7 days.   2. Vertigo - Like secondary to sinus congestion - May use over the counter antihistamine - Mucinex (no DM) -Continue to use nasal saline sprays - Discussed Epley Manuever - ondansetron (ZOFRAN-ODT) 4 MG disintegrating tablet; Take 1 tablet (4 mg total) by mouth every 8 (eight) hours as needed for nausea or vomiting.  Dispense: 20 tablet; Refill: 0 - Return to clinic if symptoms worsen.

## 2022-01-03 LAB — COVID-19, FLU A+B AND RSV
Influenza A, NAA: NOT DETECTED
Influenza B, NAA: NOT DETECTED
RSV, NAA: NOT DETECTED
SARS-CoV-2, NAA: NOT DETECTED

## 2022-01-03 LAB — SPECIMEN STATUS REPORT

## 2022-01-08 ENCOUNTER — Encounter: Payer: Self-pay | Admitting: Internal Medicine

## 2022-01-08 ENCOUNTER — Ambulatory Visit: Payer: BC Managed Care – PPO | Attending: Internal Medicine | Admitting: Internal Medicine

## 2022-01-08 VITALS — BP 132/98 | HR 70 | Ht 68.0 in | Wt 248.2 lb

## 2022-01-08 DIAGNOSIS — R001 Bradycardia, unspecified: Secondary | ICD-10-CM

## 2022-01-08 DIAGNOSIS — I10 Essential (primary) hypertension: Secondary | ICD-10-CM | POA: Diagnosis not present

## 2022-01-08 MED ORDER — MIDODRINE HCL 5 MG PO TABS: 5.0000 mg | ORAL_TABLET | ORAL | 3 refills | Status: AC

## 2022-01-08 NOTE — Progress Notes (Signed)
HPI Mr. Anctil returns today for followup. I saw him about 9 months ago. 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 He was last seen by me in February when he had another spell while visiting his father in law who was in [facility] ICU. He caries a h/o pericarditis after Covid vaccine. Since his ER visit in February, he notes he has had a couple of additional episodes of syncope.  No Known Allergies   Current Outpatient Medications  Medication Sig Dispense Refill   acetaminophen (TYLENOL) 325 MG tablet Take 650 mg by mouth every 6 (six) hours as needed.     ibuprofen (ADVIL) 200 MG tablet Take 200 mg by mouth every 6 (six) hours as needed for fever, mild pain or headache.     midodrine (PROAMATINE) 5 MG tablet Take 1 tablet (5 mg total) by mouth as directed. ( When hypotension becomes present. ) 30 tablet 3   ondansetron (ZOFRAN-ODT) 4 MG disintegrating tablet Take 1 tablet (4 mg total) by mouth every 8 (eight) hours as needed for nausea or vomiting. 20 tablet 0   Vitamin D, Ergocalciferol, (DRISDOL) 1.25 MG (50000 UNIT) CAPS capsule Take 1 capsule (50,000 Units total) by mouth every 7 (seven) days. 4 capsule 3   No current facility-administered medications for this visit.     Past Medical History:  Diagnosis Date   ADHD (attention deficit hyperactivity disorder)    as a child, does not require meds   Allergic rhinitis 06/29/2012   Asthma    h/o, no meds   Diarrhea    GERD (gastroesophageal reflux disease)    Ileitis 01/2011   biopsies negative for IBD   Symptomatic bradycardia 09/24/2019    ROS:   All systems reviewed and negative except as noted in the HPI.   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 Ace Gins distal TI (5 cm) cobblestoning and tiny erosions, biopsies benign without evidence of IBD   COLONOSCOPY N/A 04/28/2015   RMR: Minimal anal canal hemorrhoids. patchy erythema of the rectum may or may not be significant. 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    ESOPHAGOGASTRODUODENOSCOPY  01/14/2011   Dr. Elmer Ramp EGD, benign small bowel biopsy   LEFT HEART CATH AND CORONARY ANGIOGRAPHY N/A 09/24/2019   Procedure: LEFT HEART CATH AND CORONARY ANGIOGRAPHY;  Surgeon: Yvonne Kendall, MD;  Location: [facility] INVASIVE CV LAB;  Service: Cardiovascular;  Laterality: N/A;     Family History  Problem Relation Age of Onset   Hypertension Mother    Hypertension Father    Stroke Maternal Grandmother    Colon polyps Maternal Grandmother 40       partial colectomy secondary to numerous polyps   Seizures Maternal Grandmother    Stomach cancer Maternal Grandfather 70       deceased   Heart disease Paternal Grandfather        late 54s, early 30s   Seizures Paternal Uncle    Crohn's disease Neg Hx    Liver disease Neg Hx  Social History   Socioeconomic History   Marital status: Married    Spouse name: Not on file   Number of children: 4   Years of education: 12   Highest education level: High school graduate  Occupational History   Occupation: Electronics engineer    Comment:     Occupation: Sales promotion account executive for an [employer]  Tobacco Use   Smoking status: Never   Smokeless tobacco: Never  Substance and Sexual Activity   Alcohol use: No    Alcohol/week: 0.0 standard drinks of alcohol   Drug use: No   Sexual activity: Yes  Other Topics Concern   Not on file  Social History Narrative   Lives with family.   Right-handed.   Caffeine use: one cup daily.    Social  Determinants of Health   Financial Resource Strain: Not on file  Food Insecurity: Not on file  Transportation Needs: Not on file  Physical Activity: Not on file  Stress: Not on file  Social Connections: Not on file  Intimate Partner Violence: Not on file     BP (!) 132/98   Pulse 70   Ht 5\' 8"  (1.727 m)   Wt 248 lb 3.2 oz (112.6 kg)   SpO2 98%   BMI 37.74 kg/m   Physical Exam:  Well appearing NAD HEENT: Unremarkable Neck:  No JVD, no thyromegally Lymphatics:  No adenopathy Back:  No CVA tenderness Lungs:  Clear HEART:  Regular rate rhythm, no murmurs, no rubs, no clicks Abd:  soft, positive bowel sounds, no organomegally, no rebound, no guarding Ext:  2 plus pulses, no edema, no cyanosis, no clubbing Skin:  No rashes no nodules Neuro:  CN II through XII intact, motor grossly intact  EKG - nsr  DEVICE  Normal device function.  See PaceArt for details.   Assess/Plan: Autonomic dysfunction - I discussed the mechanism. No indication for ILR. We know why he is passing out and even if he has significant cardiac inhibition, PPM would be relatively contra-indicated based on his age. I asked him to take midodrine 5 mg as needed when he has a spell. Also asked him to maintain an adequate sodium diet. HTN - his bp is up and down.   Carleene Overlie Emanuel Dowson,MD

## 2022-01-08 NOTE — Patient Instructions (Addendum)
Medication Instructions:  Your physician has recommended you make the following change in your medication: START TAKING: Midodrine 5 MG  You will Take One tablet ( 5 mg ) by mouth as directed; ( When hypotension becomes present. )  Lab Work: None ordered.  If you have labs (blood work) drawn today and your tests are completely normal, you will receive your results only by: MyChart Message (if you have MyChart) OR A paper copy in the mail If you have any lab test that is abnormal or we need to change your treatment, we will call you to review the results.  Testing/Procedures: None ordered.  Follow-Up: At [facility], you and your health needs are our priority.  As part of our continuing mission to provide you with exceptional heart care, we have created designated Provider Care Teams.  These Care Teams include your primary Cardiologist (physician) and Advanced Practice Providers (APPs -  Physician Assistants and Nurse Practitioners) who all work together to provide you with the care you need, when you need it.  We recommend signing up for the patient portal called "MyChart".  Sign up information is provided on this After Visit Summary.  MyChart is used to connect with patients for Virtual Visits (Telemedicine).  Patients are able to view lab/test results, encounter notes, upcoming appointments, etc.  Non-urgent messages can be sent to your provider as well.   To learn more about what you can do with MyChart, go to [email or web address].    Your next appointment:   6 Months  The format for your next appointment:   In Person  Provider:   Lewayne Bunting, MD{or one of the following Advanced Practice Providers on your designated Care Team:   Francis Dowse, [place] Casimiro Needle "Mardelle Matte" Lanna Poche, [place]  Important Information About Sugar      Midodrine Tablets What is this medication? MIDODRINE (MI doe dreen) reduces the symptoms of low blood pressure caused by a changing position or standing.  It works by narrowing your blood vessels, which helps increase your blood pressure. This reduces episodes of dizziness, lightheadedness, or fainting. This medicine may be used for other purposes; ask your health care provider or pharmacist if you have questions. COMMON BRAND NAME(S): Orvaten, ProAmatine What should I tell my care team before I take this medication? They need to know if you have any of the following conditions: Difficulty passing urine Heart disease High blood pressure Kidney disease Overactive thyroid Pheochromocytoma An unusual or allergic reaction to midodrine, other medications, foods, dyes, or preservatives Pregnant or trying to get pregnant Breastfeeding How should I use this medication? Take this medication by mouth with water. Take it as directed on the prescription label at the same time every day. Do not take it after the evening meal, within 3 to 4 hours of bedtime, or while lying down. Keep taking it unless your care team tells you to stop. Talk to your care team about the use of this medication in children. Special care may be needed. Overdosage: If you think you have taken too much of this medicine contact a poison control center or emergency room at once. NOTE: This medicine is only for you. Do not share this medicine with others. What if I miss a dose? If you miss a dose, take it as soon as you can. If it is almost time for your next dose, take only that dose. Do not take double or extra doses. What may interact with this medication? Do not take this medication with any  of the following: MAOIs, such as Marplan, Nardil, and Parnate Ergot alkaloids Medications for colds and breathing difficulties or weight loss Procarbazine This medication may also interact with the following: Cimetidine Digoxin Flecainide Fludrocortisone Medications called alpha blockers, such as doxazosin, prazosin, and  terazosin Metformin Procainamide Quinidine Ranitidine Triamterene This list may not describe all possible interactions. Give your health care provider a list of all the medicines, herbs, non-prescription drugs, or dietary supplements you use. Also tell them if you smoke, drink alcohol, or use illegal drugs. Some items may interact with your medicine. What should I watch for while using this medication? Visit your care team for regular checks on your progress. Tell your care team if your symptoms do not start to get better or if they get worse. This medication may affect your coordination, reaction time, or judgment. Do not drive or operate machinery until you know how this medication affects you. Sit up or stand slowly to reduce the risk of dizzy or fainting spells. Drinking alcohol with this medication can increase the risk of these side effects. Your mouth may get dry. Chewing sugarless gum or sucking hard candy, and drinking plenty of water may help. Contact your care team if the problem does not go away or is severe. Do not treat yourself for coughs, colds, or pain while you are using this medication without asking your care team for advice. Some medications may increase your blood pressure. What side effects may I notice from receiving this medication? Side effects that you should report to your care team as soon as possible: Allergic reactions--skin rash, itching, hives, swelling of the face, lips, tongue, or throat Increase in blood pressure Slow heartbeat--dizziness, feeling faint or lightheaded, confusion, trouble breathing, unusual weakness or fatigue Trouble passing urine Side effects that usually do not require medical attention (report to your care team if they continue or are bothersome): Burning or tingling sensation in hands or feet Chills Increased need to urinate This list may not describe all possible side effects. Call your doctor for medical advice about side effects. You  may report side effects to FDA at 1-800-FDA-1088. Where should I keep my medication? Keep out of the reach of children and pets. Store between 15 and 30 degrees C (59 and 86 degrees F). Get rid of any unused medication after the expiration date. To get rid of medications that are no longer needed or have expired: Take the medication to a medication take-back program. Check with your pharmacy or law enforcement to find a location. If you cannot return the medication, check the label or package insert to see if the medication should be thrown out in the garbage or flushed down the toilet. If you are not sure, ask your care team. If it is safe to put it in the trash, empty the medication out of the container. Mix the medication with cat litter, dirt, coffee grounds, or other unwanted substance. Seal the mixture in a bag or container. Put it in the trash. NOTE: This sheet is a summary. It may not cover all possible information. If you have questions about this medicine, talk to your doctor, pharmacist, or health care provider.  2023 Elsevier/Gold Standard (2021-03-30 00:00:00)

## 2022-10-25 ENCOUNTER — Ambulatory Visit: Payer: BC Managed Care – PPO | Admitting: Nurse Practitioner

## 2022-10-28 ENCOUNTER — Ambulatory Visit: Payer: BC Managed Care – PPO | Admitting: Nurse Practitioner

## 2022-10-31 ENCOUNTER — Ambulatory Visit: Payer: BC Managed Care – PPO | Admitting: Nurse Practitioner

## 2022-10-31 VITALS — BP 138/76 | HR 98 | Temp 97.6°F | Ht 68.0 in | Wt 231.0 lb

## 2022-10-31 DIAGNOSIS — E782 Mixed hyperlipidemia: Secondary | ICD-10-CM | POA: Diagnosis not present

## 2022-10-31 DIAGNOSIS — Z0000 Encounter for general adult medical examination without abnormal findings: Secondary | ICD-10-CM

## 2022-10-31 DIAGNOSIS — Z0001 Encounter for general adult medical examination with abnormal findings: Secondary | ICD-10-CM

## 2022-10-31 DIAGNOSIS — Z13228 Encounter for screening for other metabolic disorders: Secondary | ICD-10-CM

## 2022-10-31 DIAGNOSIS — Z130 Encounter for screening for diseases of the blood and blood-forming organs and certain disorders involving the immune mechanism: Secondary | ICD-10-CM

## 2022-10-31 DIAGNOSIS — E669 Obesity, unspecified: Secondary | ICD-10-CM

## 2022-10-31 NOTE — Progress Notes (Signed)
Subjective:    Patient ID: Gregory Reeves, male    DOB: 1990/04/21, 32 y.o.   MRN: [id number]  HPI The patient comes in today for a wellness visit.  A review of their health history was completed.  A review of medications was also completed.  Any needed refills; none   Eating habits: Overall healthy  Regular exercise: active at job  Specialist pt sees on regular basis: cardiology  Preventative health issues were discussed.   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 Sleeping well. Reports negative testing in the past for sleep apnea.  Regular vision and dental exams.  Taking GLP1 through [employer] in [place]. Denies any side effects.    Review of Systems  Constitutional:  Negative for activity change, appetite change and fatigue.  HENT:  Negative for sore throat and trouble swallowing.   Respiratory:  Negative for cough, chest tightness, shortness of breath and wheezing.   Cardiovascular:  Negative for chest pain.  Gastrointestinal:  Negative for abdominal distention, abdominal pain, constipation, diarrhea, nausea and vomiting.  Genitourinary:  Negative for difficulty urinating, dysuria, enuresis, frequency, genital sores, penile discharge, penile pain, penile swelling, scrotal swelling, testicular pain and urgency.  Psychiatric/Behavioral:  Negative for sleep disturbance.       10/31/2022    2:51 PM  Depression screen PHQ 2/9  Decreased Interest 0  Down, Depressed, Hopeless 0  PHQ - 2 Score 0  Altered sleeping 1  Tired, decreased energy 0  Change in appetite 1  Feeling bad or failure about yourself  0  Trouble concentrating 0  Moving slowly or fidgety/restless 0  Suicidal thoughts 0  PHQ-9 Score 2  Difficult doing work/chores Not difficult at all      10/31/2022    2:51 PM  GAD 7 : Generalized Anxiety Score  Nervous, Anxious, on Edge 0  Control/stop worrying 0  Worry too much - different things  0  Trouble relaxing 0  Restless 0  Easily annoyed or irritable 0  Afraid - awful might happen 0  Total GAD 7 Score 0  Anxiety Difficulty Not difficult at all         Objective:   Physical Exam Vitals and nursing note reviewed.  Constitutional:      General: He is not in acute distress.    Appearance: He is well-developed.  Neck:     Thyroid: No thyromegaly.     Trachea: No tracheal deviation.     Comments: Thyroid non tender, no mass or goiter noted.  Cardiovascular:     Rate and Rhythm: Normal rate and regular rhythm.     Heart sounds: Normal heart sounds. No murmur heard. Pulmonary:     Effort: Pulmonary effort is normal.     Breath sounds: Normal breath sounds.  Abdominal:     General: There is no distension.     Palpations: Abdomen is soft.     Tenderness: There is no abdominal tenderness.  Genitourinary:    Comments: Defers GU exam; denies any problems.  Musculoskeletal:     Cervical back: Normal range of motion and neck supple.  Lymphadenopathy:     Cervical: No cervical adenopathy.  Skin:    General: Skin is warm and dry.  Neurological:     Mental Status: He is alert and oriented to person, place, and time.     Gait: Gait normal.     Deep Tendon Reflexes: Reflexes are normal and  symmetric.  Psychiatric:        Mood and Affect: Mood normal.        Behavior: Behavior normal.        Thought Content: Thought content normal.        Judgment: Judgment normal.    Today's Vitals   10/31/22 1446 10/31/22 1511  BP: (!) 128/90 138/76  Pulse: 98   Temp: 97.6 F (36.4 C)   SpO2: 99%   Weight: 231 lb (104.8 kg)   Height: 5\' 8"  (1.727 m)    Body mass index is 35.12 kg/m.        Assessment & Plan:   Problem List Items Addressed This Visit       Other   Mixed hyperlipidemia   Relevant Orders   Lipid panel   Obesity (BMI 30-39.9)   Other Visit Diagnoses     Annual physical exam    -  Primary   Relevant Orders   CBC with Differential/Platelet    Comprehensive metabolic panel   Lipid panel   Screening for metabolic disorder       Relevant Orders   Comprehensive metabolic panel   Screening for deficiency anemia       Relevant Orders   CBC with Differential/Platelet     [facility] care form completed.  Routine labs due late September/early October. Continue healthy habits and weight loss efforts. Return in about 1 year (around 10/31/2023) for physical.

## 2022-11-03 ENCOUNTER — Encounter: Payer: Self-pay | Admitting: Nurse Practitioner

## 2022-11-03 DIAGNOSIS — E669 Obesity, unspecified: Secondary | ICD-10-CM | POA: Insufficient documentation

## 2022-11-03 DIAGNOSIS — E782 Mixed hyperlipidemia: Secondary | ICD-10-CM | POA: Insufficient documentation

## 2022-12-20 ENCOUNTER — Telehealth: Payer: Self-pay

## 2022-12-20 NOTE — Telephone Encounter (Signed)
Patient called in and states he has been having watery diarrhea for the past couple of days and pain around the umbilical area since 3 am this morning that is not going away. He also stated had blood in the stool and thinks possibly strained while using the restroom. Advised patient to proceed to the ER for immediate evaluation, pt agreed.

## 2022-12-20 NOTE — Telephone Encounter (Signed)
I agree with that thank you 

## 2022-12-24 ENCOUNTER — Telehealth: Payer: Self-pay | Admitting: Gastroenterology

## 2022-12-24 NOTE — Telephone Encounter (Signed)
Good morning Dr. Tomasa Rand,    Supervising Provider AM 12/24/22   Patient's wife called requesting for patient to transfer his care to [facility]. Patient has history with [facility] and last had a colonoscopy in 2017. Patient's wife requesting for patient to transfer his care due to patient being dismissed from previous office and currently does not have a Gastroenterology provider. Patient is wishing to be seen for abdominal pain, hernia, and blood in stool. Patient's previous records are in [facility] for you to review and advise on scheduling.    Thank you.

## 2023-01-02 NOTE — Telephone Encounter (Signed)
Called patient to schedule. Left voicemail

## 2023-07-17 ENCOUNTER — Ambulatory Visit: Payer: Self-pay

## 2023-07-17 NOTE — Telephone Encounter (Signed)
 Chief Complaint: rash Symptoms: tick bite, rash, fever, headache, nausea Frequency: bit on Sunday, removed it today Pertinent Negatives: Patient denies vomiting, CP, SOB Disposition: [x] ED /[] Urgent Care (no appt availability in office) / [] Appointment(In office/virtual)/ []  [facility]/ [] Home Care/ [] Refused Recommended Disposition /[] [facility]/ []  Follow-up with PCP Additional Notes: RN spoke to pt's wife. Wife was also on the phone with her husband. Wife states they went hiking on Sunday and they removed a tick from his back today. Tick had probably been attached since Sunday since the other people in their group also had tick bites. Wife reports spreading rash to his arms, legs, and stomach. Wife reports pt has had a headache. Pt states he is nauseous but has not vomited. Pt had a temp of 101F last night. Pt has some neck stiffness at baseline and denies that he is experiencing worsening stiffness to day. Pt states he feels weak. RN advised wife pt should go to the ED. Pt is at work but is agreeable to go. RN advised wife if the pt develops sudden, severe headache, dizziness, unsteady gait, CP, SOB, vomiting to call 911. Wife verbalized understanding.     Copied from CRM [id number]. Topic: Clinical - Red Word Triage >> Jul 17, 2023  2:36 PM Everlene Hobby D wrote: Removed a tick from the patient, and noticed other bumps look like rocky mountain spotty fever from tick bite. Bump had been there a few days didn't realize it was a tick and removed it today. Been there about 3 days. Fever 101 Reason for Disposition  [1] 2 to 14 days following tick bite AND [2] widespread rash with fever occurs  Answer Assessment - Initial Assessment Questions 1. ATTACHED:  "Is the tick still on the skin?"  (e.g., yes, no, unsure)     Tick was removed today, was on his back 2. ONSET - TICK STILL ATTACHED:  "How long do you think the tick has been on your skin?" (e.g., hours, days, unsure)  Note:  Is  there a recent activity (camping, hiking) where the caller may have been exposed?     "If I had to guess it bit him Sunday, we went on a walking trail through the woods and many of us  had ticks" 3. ONSET - TICK NOT STILL ATTACHED: "If the tick has been removed, how long do you think the tick was attached before you removed it?" (e.g., 5 hours, 2 days). "When was this?"     N/a 4. LOCATION: "Where is the tick bite located?" (e.g., arm, leg)     Back  5. TYPE of TICK: "Is it a wood tick or a deer tick?" (e.g., deer tick, wood tick; unsure)      6. SIZE of TICK: "How big is the tick?" (e.g., size of poppy seed, apple seed, watermelon seed; unsure) Note: Deer ticks can be the size of a poppy seed (nymph) or an apple seed (adult).       "It was very small but busted with blood", size of a little seed 7. ENGORGED: "Did the tick look flat or engorged (full, swollen)?" (e.g., flat, engorged; unsure)     Swollen, dead 8. OTHER SYMPTOMS: "Do you have any other symptoms?" (e.g., fever, rash, redness at bite area, red ring around bite)     "Rash that looks like there's blood in the middle." Rash to his arm, legs, toe, stomach. Fever of 101F last night, headache. Today pt is working. Denies neck stiffness that is "  worse than it typically is." "Mainly just feels weak." Endorses nausea, no vomiting.  Protocols used: Tick Bite-A-AH

## 2023-07-18 ENCOUNTER — Emergency Department (HOSPITAL_BASED_OUTPATIENT_CLINIC_OR_DEPARTMENT_OTHER)
Admission: EM | Admit: 2023-07-18 | Discharge: 2023-07-18 | Disposition: A | Discharge status: Home / Self Care | Attending: Emergency Medicine | Admitting: Emergency Medicine

## 2023-07-18 ENCOUNTER — Other Ambulatory Visit: Payer: Self-pay

## 2023-07-18 ENCOUNTER — Ambulatory Visit

## 2023-07-18 DIAGNOSIS — R509 Fever, unspecified: Secondary | ICD-10-CM

## 2023-07-18 DIAGNOSIS — S30861A Insect bite (nonvenomous) of abdominal wall, initial encounter: Secondary | ICD-10-CM | POA: Diagnosis not present

## 2023-07-18 DIAGNOSIS — M7918 Myalgia, other site: Secondary | ICD-10-CM | POA: Insufficient documentation

## 2023-07-18 DIAGNOSIS — J392 Other diseases of pharynx: Secondary | ICD-10-CM | POA: Diagnosis not present

## 2023-07-18 DIAGNOSIS — W57XXXA Bitten or stung by nonvenomous insect and other nonvenomous arthropods, initial encounter: Secondary | ICD-10-CM | POA: Insufficient documentation

## 2023-07-18 DIAGNOSIS — M791 Myalgia, unspecified site: Secondary | ICD-10-CM | POA: Diagnosis not present

## 2023-07-18 DIAGNOSIS — R21 Rash and other nonspecific skin eruption: Secondary | ICD-10-CM | POA: Insufficient documentation

## 2023-07-18 LAB — BASIC METABOLIC PANEL WITH GFR
Anion gap: 11 (ref 5–15)
BUN: 10 mg/dL (ref 6–20)
CO2: 23 mmol/L (ref 22–32)
Calcium: 9.4 mg/dL (ref 8.9–10.3)
Chloride: 107 mmol/L (ref 98–111)
Creatinine, Ser: 0.84 mg/dL (ref 0.61–1.24)
GFR, Estimated: >60 mL/min (ref >60)
Glucose, Bld: 88 mg/dL (ref 70–99)
Potassium: 3.9 mmol/L (ref 3.5–5.1)
Sodium: 141 mmol/L (ref 135–145)

## 2023-07-18 LAB — CBC WITH DIFFERENTIAL/PLATELET
Abs Immature Granulocytes: 0.01 10*3/uL (ref 0.00–0.07)
Basophils Absolute: 0.1 10*3/uL (ref 0.0–0.1)
Basophils Relative: 1 %
Eosinophils Absolute: 0.4 10*3/uL (ref 0.0–0.5)
Eosinophils Relative: 5 %
HCT: 44 % (ref 39.0–52.0)
Hemoglobin: 15.7 g/dL (ref 13.0–17.0)
Immature Granulocytes: 0 %
Lymphocytes Relative: 32 %
Lymphs Abs: 2.2 10*3/uL (ref 0.7–4.0)
MCH: 30.3 pg (ref 26.0–34.0)
MCHC: 35.7 g/dL (ref 30.0–36.0)
MCV: 84.8 fL (ref 80.0–100.0)
Monocytes Absolute: 0.5 10*3/uL (ref 0.1–1.0)
Monocytes Relative: 7 %
Neutro Abs: 3.9 10*3/uL (ref 1.7–7.7)
Neutrophils Relative %: 55 %
Platelets: 235 10*3/uL (ref 150–400)
RBC: 5.19 MIL/uL (ref 4.22–5.81)
RDW: 13.2 % (ref 11.5–15.5)
WBC: 7 10*3/uL (ref 4.0–10.5)
nRBC: 0 % (ref 0.0–0.2)

## 2023-07-18 LAB — RESP PANEL BY RT-PCR (RSV, FLU A&B, COVID)  RVPGX2
Influenza A by PCR: NEGATIVE
Influenza B by PCR: NEGATIVE
Resp Syncytial Virus by PCR: NEGATIVE
SARS Coronavirus 2 by RT PCR: NEGATIVE

## 2023-07-18 MED ORDER — DOXYCYCLINE HYCLATE 100 MG PO TABS
100.0000 mg | ORAL_TABLET | Freq: Once | ORAL | Status: AC
  Administered 2023-07-18: 100 mg via ORAL
  Filled 2023-07-18: qty 1

## 2023-07-18 MED ORDER — DOXYCYCLINE HYCLATE 100 MG PO CAPS: 100.0000 mg | ORAL_CAPSULE | Freq: Two times a day (BID) | ORAL | 0 refills | Status: AC

## 2023-07-18 MED ORDER — ONDANSETRON 4 MG PO TBDP: 4.0000 mg | ORAL_TABLET | Freq: Three times a day (TID) | ORAL | 0 refills | Status: AC | PRN

## 2023-07-18 NOTE — ED Triage Notes (Signed)
 Pt Gregory Reeves, ambulatory reporting he had a tick bite on the abd on Sunday and began having multiple red spots that began draining. Pt further reports he began having bodyaches, pain in the back radiating to the neck, fevers, and sore throat worsening since Wed. Pt states he pulled another tick off the abd yesterday. Afebrile at present, took Ibuprofen  at 0900 this morning, states highest temp was 103.5 at home.

## 2023-07-18 NOTE — ED Provider Notes (Signed)
 [place] EMERGENCY DEPARTMENT AT [facility] Provider Note   CSN: [id number] Arrival date & time: 07/18/23  0935     History  Chief Complaint  Patient presents with   Fever    Gregory Reeves is a 33 y.o. male.  HPI   33 year old male presents emergency department with complaints of tick bite, fever.  Patient states that he was working in the woods on Sunday tried to cut through a path.  States that he did notice a tick bite on his lower left abdomen at that time.  States that he subsequently began to have bodyaches, pain in upper back and neck, sore throat, fever that began on Wednesday.  States he has been taken ibuprofen  for fever with last dose this morning.  States that yesterday, his wife noticed a tick on his right upper back and removed it after greater than 72 hours of being on the patient's body.  Patient denies any visual streams, gait abnormality, slurred speech, facial droop, weakness/sensory deficits in upper lower extremities.  Denies any chest pain, nausea, vomiting.  Past medical history significant for GERD, asthma, ADHD, hypertension, hyperlipidemia  Home Medications Prior to Admission medications   Medication Sig Start Date End Date Taking? Authorizing Provider  doxycycline  (VIBRAMYCIN ) 100 MG capsule Take 1 capsule (100 mg total) by mouth 2 (two) times daily. 07/18/23  Yes Neil Balls A, PA  ondansetron  (ZOFRAN -ODT) 4 MG disintegrating tablet Take 1 tablet (4 mg total) by mouth every 8 (eight) hours as needed. 07/18/23  Yes Neil Balls A, PA  acetaminophen  (TYLENOL ) 325 MG tablet Take 650 mg by mouth every 6 (six) hours as needed.    [provider]  ibuprofen  (ADVIL ) 200 MG tablet Take 200 mg by mouth every 6 (six) hours as needed for fever, mild pain or headache.    [provider]  midodrine  (PROAMATINE ) 5 MG tablet Take 1 tablet (5 mg total) by mouth as directed. ( When hypotension becomes present. ) 01/08/22   Tammie Fall,  MD  Vitamin D , Ergocalciferol , (DRISDOL ) 1.25 MG (50000 UNIT) CAPS capsule Take 1 capsule (50,000 Units total) by mouth every 7 (seven) days. 12/11/21   Bennet Brasil, MD      Allergies    Patient has no known allergies.    Review of Systems   Review of Systems  All other systems reviewed and are negative.   Physical Exam Updated Vital Signs BP 130/87 (BP Location: Right Arm)   Pulse 80   Temp 98.7 F (37.1 C) (Oral)   Resp 16   SpO2 100%  Physical Exam Vitals and nursing note reviewed.  Constitutional:      General: He is not in acute distress.    Appearance: He is well-developed.  HENT:     Head: Normocephalic and atraumatic.     Mouth/Throat:     Comments: Mild posterior pharyngeal erythema.  Uvula midline with symmetrical phonation.  No sublingual or similar swelling.  No trismus.  Tonsils 1+ bilaterally without exudate. Eyes:     Conjunctiva/sclera: Conjunctivae normal.  Neck:     Comments: Patient reports pain mainly with extension of the neck.  No pain with flexion of the neck.  Kernig negative. Cardiovascular:     Rate and Rhythm: Normal rate and regular rhythm.     Heart sounds: No murmur heard. Pulmonary:     Effort: Pulmonary effort is normal. No respiratory distress.     Breath sounds: Normal breath sounds.  Abdominal:  Palpations: Abdomen is soft.     Tenderness: There is no abdominal tenderness.  Musculoskeletal:        General: No swelling.     Cervical back: Neck supple.  Skin:    General: Skin is warm and dry.     Capillary Refill: Capillary refill takes less than 2 seconds.     Findings: Rash present.     Comments: No involvement of the palms or soles.  Rash appreciated bilateral lower extremities as above.  Similar-appearing rash left lower abdomen, 3 spots on the back.  Neurological:     Mental Status: He is alert.  Psychiatric:        Mood and Affect: Mood normal.            ED Results / Procedures / Treatments   Labs (all  labs ordered are listed, but only abnormal results are displayed) Labs Reviewed  RESP PANEL BY RT-PCR (RSV, FLU A&B, COVID)  [id number]  CBC WITH DIFFERENTIAL/PLATELET  BASIC METABOLIC PANEL WITH GFR  LYME DISEASE SEROLOGY W/REFLEX  SPOTTED FEVER GROUP ANTIBODIES    EKG None  Radiology No results found.  Procedures Procedures    Medications Ordered in ED Medications  doxycycline  (VIBRA -TABS) tablet 100 mg (100 mg Oral Given 07/18/23 1042)    ED Course/ Medical Decision Making/ A&P Clinical Course as of 07/18/23 1159  Fri Jul 18, 2023  1007 Sunday woods tick left. Back tick yesterday Feverm   [CR]    Clinical Course User Index [CR] [place] Butter, PA                                 Medical Decision Making Amount and/or Complexity of Data Reviewed Labs: ordered.  Risk Prescription drug management.   This patient presents to the ED for concern of fever, body aches, sore throat, this involves an extensive number of treatment options, and is a complaint that carries with it a high risk of complications and morbidity.  The differential diagnosis includes recommend spotted fever, Lyme, other tickborne illness, viral URI, meningitis, other   Co morbidities that complicate the patient evaluation  See HPI   Additional history obtained:  Additional history obtained from EMR External records from outside source obtained and reviewed including hospital records   Lab Tests:  I Ordered, and personally interpreted labs.  The pertinent results include: No leukocytosis.  No evidence of anemia.  Platelets within range.  No electrolyte abnormalities.  No renal dysfunction.  Viral testing negative.  Tickborne illness labs pending   Imaging Studies ordered:  N/a   Cardiac Monitoring: / EKG:  The patient was maintained on a cardiac monitor.  I personally viewed and interpreted the cardiac monitored which showed an underlying rhythm of: Sinus rhythm   Consultations  Obtained:  I requested consultation with attending Dr. Carylon Claude who was in agreement treatment plan going forward.   Problem List / ED Course / Critical interventions / Medication management  Tick bite, fever, myalgia I ordered medication including doxycycline    Reevaluation of the patient after these medicines showed that the patient stayed the same I have reviewed the patients home medicines and have made adjustments as needed   Social Determinants of Health:  Denies tobacco, licit drug use.   Test / Admission - Considered:  Tick bite, fever, myalgia Vitals signs significant for hypertension blood pressure 145/94. Otherwise within normal range and stable throughout visit. Laboratory studies significant  for: See above 33 year old male presents emergency department with complaints of tick bite, fever.  Patient states that he was working in the woods on "Sunday tried to cut through a path.  States that he did notice a tick bite on his lower left abdomen at that time.  States that he subsequently began to have bodyaches, pain in upper back and neck, sore throat, fever that began on Wednesday.  States he has been taken ibuprofen for fever with last dose this morning.  States that yesterday, his wife noticed a tick on his right upper back and removed it after greater than 72 hours of being on the patient's body.  Patient denies any visual streams, gait abnormality, slurred speech, facial droop, weakness/sensory deficits in upper lower extremities.  Denies any chest pain, nausea, vomiting. On exam, rash appreciated left, right lower extremity, left lower abdomen, back as above.  Patient with pain of neck/upper back muscles with extension but without clinical evidence of meningismus.  Kernig and Brudzinski  to both negative.  Labs reassuring with no white count with normal differential.  Metabolic panel unremarkable.  Viral testing negative.  Tickborne illness labs pending at this time. One area of  right lower extremity with appearing target-like rash.  No involvement of the palms or soles to be suspicious of recommend spotted fever.  Will treat empirically with doxycycline for 10 days.  Will recommend strict returning if symptoms concerning for meningitis/encephalitis return or patient develops chest pain, shortness of breath or other worrisome signs and symptoms discussed.  Will recommend close follow-up with PCP in the outpatient setting for reassessment.  Treatment plan discussed with patient and he acknowledged understanding was agreeable to said plan.  Patient overall well-appearing, afebrile in no acute distress. Worrisome signs and symptoms were discussed with the patient, and the patient acknowledged understanding to return to the ED if noticed. Patient was stable upon discharge.          Final Clinical Impression(s) / ED Diagnoses Final diagnoses:  Tick bite, unspecified site, initial encounter  Myalgia  Fever, unspecified fever cause    Rx / DC Orders ED Discharge Orders          Ordered    doxycycline (VIBRAMYCIN) 100 MG capsule  2 times daily        07/18/23 1138    ondansetron (ZOFRAN-ODT) 4 MG disintegrating tablet  Every 8 hours PRN        05" /16/25 1138              Carver Butter, [place] 07/18/23 1159    Flonnie Humphrey, DO 07/19/23 0900

## 2023-07-18 NOTE — ED Notes (Signed)
 Discharge paperwork given and verbally understood.

## 2023-07-18 NOTE — Discharge Instructions (Addendum)
 As discussed, your blood test appeared normal.  Your white count was normal.  Your viral testing for COVID flu RSV was negative.  Suspect tickborne illness as cause of your symptoms.  We have sent off the tick labs but they will be back today.  Follow MyChart for results.  Will treat you empirically with doxycycline.  Please take as directed.  Continue take Tylenol /Motrin  for any fevers.  Will send in medicine for nausea to use as needed.  Return if you develop confusion, symptoms concerning for stroke as we discussed, or worsening neck pain.  Otherwise, recommend follow-up with primary care.

## 2023-07-21 LAB — LYME DISEASE SEROLOGY W/REFLEX: Lyme Total Antibody EIA: NEGATIVE

## 2023-07-22 LAB — SPOTTED FEVER GROUP ANTIBODIES
Spotted Fever Group IgG: <1:64 {titer}
Spotted Fever Group IgM: <1:64 {titer}

## 2024-02-20 ENCOUNTER — Encounter: Payer: Self-pay | Admitting: Family Medicine

## 2024-02-20 ENCOUNTER — Ambulatory Visit: Payer: Self-pay | Admitting: *Deleted

## 2024-02-20 ENCOUNTER — Ambulatory Visit: Admitting: Family Medicine

## 2024-02-20 VITALS — BP 124/82 | HR 75 | Temp 98.0°F | Ht 68.0 in | Wt 195.0 lb

## 2024-02-20 DIAGNOSIS — J019 Acute sinusitis, unspecified: Secondary | ICD-10-CM

## 2024-02-20 MED ORDER — AMOXICILLIN-POT CLAVULANATE 875-125 MG PO TABS: 1.0000 | ORAL_TABLET | Freq: Two times a day (BID) | ORAL | 0 refills | Status: AC

## 2024-02-20 NOTE — Progress Notes (Signed)
 "  Patient Office Visit  Assessment & Plan:  Acute sinusitis, recurrence not specified, unspecified location -     Amoxicillin -Pot Clavulanate; Take 1 tablet by mouth 2 (two) times daily.  Dispense: 20 tablet; Refill: 0   Assessment and Plan    Acute sinusitis Symptoms and duration suggest bacterial sinusitis. Lungs clear, reducing pneumonia likelihood. COVID-19 and RSV testing not pursued due to symptom duration. - Prescribed Augmentin BID for 10 days. - Offered prednisone  for inflammation and pressure, advised on potential side effects. - Continue Flonase PRN.          No follow-ups on file.   Subjective:    Patient ID: Gregory Reeves, male    DOB: [birth year]  Age: 33 y.o. MRN: [id number]  Chief Complaint  Patient presents with   Nasal Congestion    Nasal congestion associated with nose bleeding and facial pain. Pt last nose bleed was on Sunday 12/14. Pt reports sx started 3 weeks ago.     HPI Discussed the use of AI scribe software for clinical note transcription with the patient, who gave verbal consent to proceed.  History of Present Illness        History of Present Illness Gregory Reeves is a 33 year old male who presents with facial pressure and nosebleeds for three weeks.  He has been experiencing facial pressure for about three weeks, describing the sensation as 'sonicy type things'. Over-the-counter medications such as Zyrtec and a humidifier have been used to alleviate symptoms, along with saline nasal spray, which provided some relief.  He has frequent epistaxis over the past two weeks, described as 'pouring' and requiring pressure to stop. The bleeding occurs primarily when blowing his nose, with the presence of bloody mucus.  He reports low-grade fevers but no high-grade fevers or chills. There has been no recent dental work and no significant sinus infections in the past.  He mentions swelling in front of his earlobe, which has been increasing in size  and is sore to the touch. He also feels congested.  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 He mentions that his oldest son has been sick with a virus recently.  Physical Exam HEENT: Nasal passages not swollen. Postnasal drainage present. CHEST: Lungs clear to auscultation, no wheezing.  Assessment and Plan Acute sinusitis Symptoms and duration suggest bacterial sinusitis. Lungs clear, reducing pneumonia likelihood. COVID-19 and RSV testing not pursued due to symptom duration. - Prescribed Augmentin BID for 10 days. - Offered prednisone  for inflammation and pressure, advised on potential side effects. - Continue Flonase PRN.    The ASCVD Risk score (Arnett DK, et al., 2019) failed to calculate for the following reasons:   The 2019 ASCVD risk score is only valid for ages 29 to 58  Past Medical History:  Diagnosis Date   ADHD (attention deficit hyperactivity disorder)    as a child, does not require meds   Allergic rhinitis 06/29/2012   Asthma    h/o, no meds   Diarrhea    GERD (gastroesophageal reflux disease)    Ileitis 01/2011   biopsies negative for IBD   Symptomatic bradycardia 09/24/2019   Past Surgical History:  Procedure Laterality Date   ABDOMINAL EXPLORATION SURGERY  08/02/2010   [facility]   APPENDECTOMY  08-02-2010   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 Rockwell distal TI (5 cm) cobblestoning and tiny erosions, biopsies benign without evidence of IBD   COLONOSCOPY N/A 04/28/2015   RMR: Minimal anal canal hemorrhoids. patchy erythema of the rectum may or may not be significant. 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    ESOPHAGOGASTRODUODENOSCOPY  01/14/2011   Dr. Tisha EGD, benign small bowel biopsy   LEFT HEART CATH AND CORONARY ANGIOGRAPHY N/A 09/24/2019   Procedure: LEFT HEART CATH AND CORONARY ANGIOGRAPHY;  Surgeon: Mady Bruckner, MD;  Location: [facility] INVASIVE CV LAB;  Service: Cardiovascular;  Laterality: N/A;   Social History[1] Family History  Problem Relation Age of Onset   Hypertension Mother    Hypertension Father    Stroke Maternal Grandmother    Colon polyps Maternal Grandmother 40       partial colectomy secondary to numerous polyps   Seizures Maternal Grandmother    Stomach cancer Maternal Grandfather 85       deceased   Heart disease Paternal Grandfather        late 25s, early 26s   Seizures Paternal Uncle    Crohn's disease Neg Hx    Liver disease Neg Hx    Allergies[2]  ROS    Objective:    BP 124/82   Pulse 75   Temp 98 F (36.7 C)   Ht 5' 8 (1.727 m)   Wt 195 lb (88.5 kg)   SpO2 99%   BMI 29.65 kg/m  BP Readings from Last 3 Encounters:  02/20/24 124/82  07/18/23 130/87  10/31/22 138/76   Wt Readings from Last 3 Encounters:  02/20/24 195 lb (88.5 kg)  10/31/22 231 lb (104.8 kg)  01/08/22 248 lb 3.2 oz (112.6 kg)    Physical Exam Vitals and nursing note reviewed.  Constitutional:      General: He is not in acute distress.    Appearance: Normal appearance.  HENT:     Head: Normocephalic.     Right Ear: Tympanic membrane, ear canal and external ear normal.     Left Ear: Tympanic membrane, ear canal and external ear normal.     Nose: Congestion present.     Mouth/Throat:     Pharynx: No oropharyngeal exudate or posterior oropharyngeal erythema.  Eyes:     Extraocular Movements: Extraocular movements intact.     Pupils: Pupils are equal, round, and reactive to light.  Cardiovascular:     Rate and Rhythm: Normal rate and regular rhythm.     Heart sounds: Normal heart sounds.   Pulmonary:     Effort: Pulmonary effort is normal.     Breath sounds: Normal breath sounds. No wheezing or rhonchi.  Musculoskeletal:     Right lower leg: No edema.     Left lower leg: No edema.  Neurological:     General: No focal deficit present.     Mental Status: He is alert and oriented to person, place, and time.  Psychiatric:        Mood and Affect: Mood normal.        Behavior: Behavior normal.      No results found for any visits on 02/20/24.          [1]  Social History Tobacco Use   Smoking status: Never   Smokeless tobacco: Never  Substance Use Topics  Alcohol use: No    Alcohol/week: 0.0 standard drinks of alcohol   Drug use: No  [2] No Known Allergies  "

## 2024-02-20 NOTE — Telephone Encounter (Signed)
 FYI Only or Action Required?: FYI only for provider: appointment scheduled on 12/19.  Patient was last seen in primary care on 10/31/2022 by Mauro Elveria BROCKS, NP.  Called Nurse Triage reporting Sinusitis.  Symptoms began several weeks ago.  Interventions attempted: Rest, hydration, or home remedies.  Symptoms are: gradually worsening.  Triage Disposition: See HCP Within 4 Hours (Or PCP Triage)  Patient/caregiver understands and will follow disposition?: Yes  Copied from CRM #[id number]. Topic: Clinical - Red Word Triage >> Feb 20, 2024  8:47 AM Lonell PEDLAR wrote: Red Word that prompted transfer to Nurse Triage: frequent nose bleeds. Recent swelling in face (nose, ear and lips) Right ear pain Reason for Disposition  [1] SEVERE sinus pain (e.g., excruciating) AND [2] not improved 2 hours after pain medicine  Answer Assessment - Initial Assessment Questions 1. LOCATION: Where does it hurt?      Nasal congestion, sores/bumps in nose- L, ear pain, lump-R side of face on cheek 2. ONSET: When did the sinus pain start?  (e.g., hours, days)      3 weeks 3. SEVERITY: How bad is the pain?   (Scale 0-10; or none, mild, moderate or severe)     Moderate to severe, 8/10 4. RECURRENT SYMPTOM: Have you ever had sinus problems before? If Yes, ask: When was the last time? and What happened that time?      no 5. NASAL CONGESTION: Is the nose blocked? If Yes, ask: Can you open it or must you breathe through your mouth?     Not blocked 6. NASAL DISCHARGE: Do you have discharge from your nose? If so ask, What color?     Blood from sores 7. FEVER: Do you have a fever? If Yes, ask: What is it, how was it measured, and when did it start?      Yes- 100.4, started last week 8. OTHER SYMPTOMS: Do you have any other symptoms? (e.g., sore throat, cough, earache, difficulty breathing)     R ear pain  Protocols used: Sinus Pain or Congestion-A-AH

## 2024-03-11 ENCOUNTER — Encounter: Payer: Self-pay | Admitting: Family Medicine

## 2024-03-12 ENCOUNTER — Other Ambulatory Visit: Payer: Self-pay | Admitting: Family Medicine

## 2024-03-12 ENCOUNTER — Ambulatory Visit: Payer: Self-pay | Admitting: Family Medicine

## 2024-03-12 ENCOUNTER — Other Ambulatory Visit (HOSPITAL_COMMUNITY)
Admission: RE | Admit: 2024-03-12 | Discharge: 2024-03-12 | Disposition: A | Source: Ambulatory Visit | Attending: Family Medicine | Admitting: Family Medicine

## 2024-03-12 ENCOUNTER — Telehealth: Payer: Self-pay | Admitting: Family Medicine

## 2024-03-12 DIAGNOSIS — K921 Melena: Secondary | ICD-10-CM | POA: Diagnosis present

## 2024-03-12 DIAGNOSIS — R1030 Lower abdominal pain, unspecified: Secondary | ICD-10-CM | POA: Insufficient documentation

## 2024-03-12 LAB — HEPATIC FUNCTION PANEL
ALT: 23 U/L (ref 0–44)
AST: 19 U/L (ref 15–41)
Albumin: 4.6 g/dL (ref 3.5–5.0)
Alkaline Phosphatase: 109 U/L (ref 38–126)
Bilirubin, Direct: 0.3 mg/dL — ABNORMAL HIGH (ref 0.0–0.2)
Indirect Bilirubin: 0.4 mg/dL (ref 0.3–0.9)
Total Bilirubin: 0.6 mg/dL (ref 0.0–1.2)
Total Protein: 7.1 g/dL (ref 6.5–8.1)

## 2024-03-12 LAB — CBC
HCT: 48 % (ref 39.0–52.0)
Hemoglobin: 16.5 g/dL (ref 13.0–17.0)
MCH: 29.5 pg (ref 26.0–34.0)
MCHC: 34.4 g/dL (ref 30.0–36.0)
MCV: 85.9 fL (ref 80.0–100.0)
Platelets: 256 K/uL (ref 150–400)
RBC: 5.59 MIL/uL (ref 4.22–5.81)
RDW: 12.6 % (ref 11.5–15.5)
WBC: 8.9 K/uL (ref 4.0–10.5)
nRBC: 0 % (ref 0.0–0.2)

## 2024-03-12 LAB — BASIC METABOLIC PANEL WITH GFR
Anion gap: 13 (ref 5–15)
BUN: 13 mg/dL (ref 6–20)
CO2: 24 mmol/L (ref 22–32)
Calcium: 9.3 mg/dL (ref 8.9–10.3)
Chloride: 105 mmol/L (ref 98–111)
Creatinine, Ser: 0.9 mg/dL (ref 0.61–1.24)
GFR, Estimated: >60 mL/min
Glucose, Bld: 100 mg/dL — ABNORMAL HIGH (ref 70–99)
Potassium: 4.2 mmol/L (ref 3.5–5.1)
Sodium: 142 mmol/L (ref 135–145)

## 2024-03-12 LAB — LIPASE, BLOOD: Lipase: 34 U/L (ref 11–51)

## 2024-03-12 LAB — SEDIMENTATION RATE: Sed Rate: 2 mm/h (ref 0–15)

## 2024-03-12 NOTE — Telephone Encounter (Signed)
 Nurses-please connect with Hunter-his wife  Lets get just done with an appointment for early this coming week with Damien Pringle our nurse practitioner  If patient is willing to do stat lab work today we can do this through the lab at the hospital if he is unable to do that today we can order labs at [facility] and he can get this drawn on Monday then see Damien on Tuesday or Wednesday (my schedule is booked up)  Please keep me in the loop I will talk with Damien about what is going on  More than likely we will need to refer him to gastroenterology for further workup and may well need to do additional testing but this will get the initial steps handled

## 2024-03-12 NOTE — Progress Notes (Signed)
 Patient is having some intermittent loose stools with blood in them.  We have set him up for an office visit next week Redoing stat labs today  May need to have further workup including gastroenterology consult as well as fecal calprotectin test possibly other test as well depending on findings from the exam  Family will do the stat labs today

## 2024-03-12 NOTE — Telephone Encounter (Signed)
 error

## 2024-03-15 ENCOUNTER — Ambulatory Visit: Admitting: Family Medicine

## 2024-03-15 VITALS — BP 127/87 | HR 71 | Temp 98.0°F | Ht 68.0 in | Wt 200.1 lb

## 2024-03-15 DIAGNOSIS — R1030 Lower abdominal pain, unspecified: Secondary | ICD-10-CM

## 2024-03-15 DIAGNOSIS — K921 Melena: Secondary | ICD-10-CM

## 2024-03-15 NOTE — Progress Notes (Signed)
" ° °  Subjective:    Patient ID: Gregory Reeves, male    DOB: [birth year], 34 y.o.   MRN: [id number]  HPI Patient is in room 6  Patient is here for having blood in stool. Started last Monday  Patient went and got blood work completed last Friday  Patient has had ongoing troubles with loose stools in the past but more recently has had mucus and blood in his stool and several times last week and over the weekend he had mainly blood with some mucus Some intermittent lower abdominal cramping No nausea or vomiting Denies any high fever or chills No weight loss that was unintentional He did not take GLP-1's for a period of time but this was not associated with the symptoms  Appetite going well  Review of Systems     Objective:   Physical Exam  General-in no acute distress Eyes-no discharge Lungs-respiratory rate normal, CTA CV-no murmurs,RRR Extremities skin warm dry no edema Neuro grossly normal Behavior normal, alert Abdomen is soft with some mild discomfort in the lower abdomen      Assessment & Plan:  Lower abdominal tenderness Ongoing mucousy stools with discomfort Will go ahead with referral to gastroenterology Stool testing Additional lab testing Will also go ahead and pursue consultation with gastroenterology "

## 2024-03-18 LAB — ALPHA-GAL PANEL
Allergen Lamb IgE: 1.47 kU/L — AB
Beef IgE: 2.19 kU/L — AB
IgE (Immunoglobulin E), Serum: 172 [IU]/mL (ref 6–495)
O215-IgE Alpha-Gal: 4.86 kU/L — AB
Pork IgE: 1.11 kU/L — AB

## 2024-03-18 LAB — C-REACTIVE PROTEIN: CRP: 1 mg/L (ref 0–10)

## 2024-03-18 LAB — TISSUE TRANSGLUTAMINASE ABS,IGG,IGA
t-Transglutaminase (tTG) IgA: <2 U/mL (ref 0–3)
t-Transglutaminase (tTG) IgG: 5 U/mL (ref 0–5)

## 2024-03-22 ENCOUNTER — Ambulatory Visit: Payer: Self-pay | Admitting: Family Medicine

## 2024-03-22 DIAGNOSIS — K921 Melena: Secondary | ICD-10-CM

## 2024-03-22 MED ORDER — EPINEPHRINE 0.3 MG/0.3ML IJ SOAJ: 0.3000 mg | INTRAMUSCULAR | 2 refills | Status: AC | PRN

## 2024-03-26 NOTE — Progress Notes (Signed)
 Called patient and phone is completed off

## 2024-03-30 NOTE — Progress Notes (Signed)
 Order placed
